# Patient Record
Sex: Female | Born: 1970 | Race: White | Hispanic: No | Marital: Single | Smoking: Never smoker
Health system: Southern US, Community
[De-identification: ages and names within clinical notes are randomized; demographics above are authoritative.]

## PROBLEM LIST (undated history)

## (undated) DIAGNOSIS — F32A Depression, unspecified: Secondary | ICD-10-CM

## (undated) DIAGNOSIS — K219 Gastro-esophageal reflux disease without esophagitis: Secondary | ICD-10-CM

## (undated) DIAGNOSIS — M069 Rheumatoid arthritis, unspecified: Secondary | ICD-10-CM

## (undated) DIAGNOSIS — G43909 Migraine, unspecified, not intractable, without status migrainosus: Secondary | ICD-10-CM

## (undated) DIAGNOSIS — M810 Age-related osteoporosis without current pathological fracture: Secondary | ICD-10-CM

## (undated) DIAGNOSIS — S82009A Unspecified fracture of unspecified patella, initial encounter for closed fracture: Secondary | ICD-10-CM

## (undated) DIAGNOSIS — F419 Anxiety disorder, unspecified: Secondary | ICD-10-CM

## (undated) DIAGNOSIS — G2581 Restless legs syndrome: Secondary | ICD-10-CM

## (undated) DIAGNOSIS — G4730 Sleep apnea, unspecified: Secondary | ICD-10-CM

## (undated) HISTORY — PX: BREAST ENHANCEMENT SURGERY: SHX7

## (undated) HISTORY — DX: Depression, unspecified: F32.A

## (undated) HISTORY — DX: Restless legs syndrome: G25.81

## (undated) HISTORY — DX: Rheumatoid arthritis, unspecified: M06.9

## (undated) HISTORY — DX: Anxiety disorder, unspecified: F41.9

## (undated) HISTORY — DX: Migraine, unspecified, not intractable, without status migrainosus: G43.909

---

## 1989-11-29 HISTORY — PX: OTHER SURGICAL HISTORY: SHX169

## 2018-12-15 DIAGNOSIS — F418 Other specified anxiety disorders: Secondary | ICD-10-CM | POA: Insufficient documentation

## 2018-12-15 DIAGNOSIS — R5383 Other fatigue: Secondary | ICD-10-CM | POA: Insufficient documentation

## 2019-03-28 DIAGNOSIS — Z7960 Long term (current) use of unspecified immunomodulators and immunosuppressants: Secondary | ICD-10-CM | POA: Insufficient documentation

## 2019-05-03 DIAGNOSIS — R339 Retention of urine, unspecified: Secondary | ICD-10-CM | POA: Insufficient documentation

## 2019-07-11 DIAGNOSIS — F988 Other specified behavioral and emotional disorders with onset usually occurring in childhood and adolescence: Secondary | ICD-10-CM | POA: Insufficient documentation

## 2020-07-11 DIAGNOSIS — Z5181 Encounter for therapeutic drug level monitoring: Secondary | ICD-10-CM | POA: Diagnosis not present

## 2020-07-11 DIAGNOSIS — R252 Cramp and spasm: Secondary | ICD-10-CM | POA: Diagnosis not present

## 2020-07-11 DIAGNOSIS — Z1283 Encounter for screening for malignant neoplasm of skin: Secondary | ICD-10-CM | POA: Diagnosis not present

## 2020-07-11 DIAGNOSIS — Z13220 Encounter for screening for lipoid disorders: Secondary | ICD-10-CM | POA: Diagnosis not present

## 2020-08-19 DIAGNOSIS — F329 Major depressive disorder, single episode, unspecified: Secondary | ICD-10-CM | POA: Diagnosis not present

## 2020-08-19 DIAGNOSIS — G2581 Restless legs syndrome: Secondary | ICD-10-CM | POA: Diagnosis not present

## 2020-08-19 DIAGNOSIS — Z79899 Other long term (current) drug therapy: Secondary | ICD-10-CM | POA: Diagnosis not present

## 2020-08-19 DIAGNOSIS — M069 Rheumatoid arthritis, unspecified: Secondary | ICD-10-CM | POA: Diagnosis not present

## 2020-08-29 DIAGNOSIS — F3341 Major depressive disorder, recurrent, in partial remission: Secondary | ICD-10-CM | POA: Diagnosis not present

## 2020-09-19 DIAGNOSIS — G43909 Migraine, unspecified, not intractable, without status migrainosus: Secondary | ICD-10-CM | POA: Diagnosis not present

## 2020-09-23 DIAGNOSIS — F419 Anxiety disorder, unspecified: Secondary | ICD-10-CM | POA: Diagnosis not present

## 2020-09-29 DIAGNOSIS — L578 Other skin changes due to chronic exposure to nonionizing radiation: Secondary | ICD-10-CM | POA: Diagnosis not present

## 2020-09-29 DIAGNOSIS — L570 Actinic keratosis: Secondary | ICD-10-CM | POA: Diagnosis not present

## 2020-09-29 DIAGNOSIS — C44519 Basal cell carcinoma of skin of other part of trunk: Secondary | ICD-10-CM | POA: Diagnosis not present

## 2020-10-10 DIAGNOSIS — F4323 Adjustment disorder with mixed anxiety and depressed mood: Secondary | ICD-10-CM | POA: Diagnosis not present

## 2020-10-15 DIAGNOSIS — F332 Major depressive disorder, recurrent severe without psychotic features: Secondary | ICD-10-CM | POA: Diagnosis not present

## 2020-10-29 ENCOUNTER — Other Ambulatory Visit: Payer: Self-pay | Admitting: Neurology

## 2020-10-29 ENCOUNTER — Encounter: Payer: Self-pay | Admitting: Neurology

## 2020-10-29 DIAGNOSIS — Z01419 Encounter for gynecological examination (general) (routine) without abnormal findings: Secondary | ICD-10-CM | POA: Diagnosis not present

## 2020-10-30 ENCOUNTER — Ambulatory Visit (INDEPENDENT_AMBULATORY_CARE_PROVIDER_SITE_OTHER): Payer: BC Managed Care – PPO | Admitting: Neurology

## 2020-10-30 ENCOUNTER — Encounter: Payer: Self-pay | Admitting: Neurology

## 2020-10-30 VITALS — BP 126/78 | HR 85 | Ht 68.5 in | Wt 181.0 lb

## 2020-10-30 DIAGNOSIS — G44011 Episodic cluster headache, intractable: Secondary | ICD-10-CM | POA: Diagnosis not present

## 2020-10-30 DIAGNOSIS — R519 Headache, unspecified: Secondary | ICD-10-CM

## 2020-10-30 DIAGNOSIS — G478 Other sleep disorders: Secondary | ICD-10-CM

## 2020-10-30 DIAGNOSIS — M0579 Rheumatoid arthritis with rheumatoid factor of multiple sites without organ or systems involvement: Secondary | ICD-10-CM

## 2020-10-30 DIAGNOSIS — G2581 Restless legs syndrome: Secondary | ICD-10-CM | POA: Diagnosis not present

## 2020-10-30 MED ORDER — EMGALITY (300 MG DOSE) 100 MG/ML ~~LOC~~ SOSY: 100.0000 mg | PREFILLED_SYRINGE | SUBCUTANEOUS | 5 refills | Status: DC

## 2020-10-30 MED ORDER — EMGALITY 120 MG/ML ~~LOC~~ SOSY: 240.0000 mg | PREFILLED_SYRINGE | Freq: Once | SUBCUTANEOUS | 0 refills | Status: AC

## 2020-10-30 MED ORDER — EMGALITY 120 MG/ML ~~LOC~~ SOAJ: 120.0000 mg | SUBCUTANEOUS | 5 refills | Status: DC

## 2020-10-30 NOTE — Addendum Note (Signed)
Addended by: Judi Cong on: 10/30/2020 10:50 AM   Modules accepted: Orders

## 2020-10-30 NOTE — Patient Instructions (Signed)
Screening for Sleep Apnea  Sleep apnea is a condition in which breathing pauses or becomes shallow during sleep. Sleep apnea screening is a test to determine if you are at risk for sleep apnea. The test is easy and only takes a few minutes. Your health care provider may ask you to have this test in preparation for surgery or as part of a physical exam. What are the symptoms of sleep apnea? Common symptoms of sleep apnea include:  Snoring.  Restless sleep.  Daytime sleepiness.  Pauses in breathing.  Choking during sleep.  Irritability.  Forgetfulness.  Trouble thinking clearly.  Depression.  Personality changes. Most people with sleep apnea are not aware that they have it. Why should I get screened? Getting screened for sleep apnea can help:  Ensure your safety. It is important for your health care providers to know whether or not you have sleep apnea, especially if you are having surgery or have other long-term (chronic) health conditions.  Improve your health and allow you to get a better night's rest. Restful sleep can help you: ? Have more energy. ? Lose weight. ? Improve high blood pressure. ? Improve diabetes management. ? Prevent stroke. ? Prevent car accidents. How is screening done? Screening usually includes being asked a list of questions about your sleep quality. Some questions you may be asked include:  Do you snore?  Is your sleep restless?  Do you have daytime sleepiness?  Has a partner or spouse told you that you stop breathing during sleep?  Have you had trouble concentrating or memory loss? If your screening test is positive, you are at risk for the condition. Further testing may be needed to confirm a diagnosis of sleep apnea. Where to find more information You can find screening tools online or at your health care clinic. For more information about sleep apnea screening and healthy sleep, visit these websites:  Centers for Disease Control and  Prevention: DetailSports.is  American Sleep Apnea Association: www.sleepapnea.org Contact a health care provider if:  You think that you may have sleep apnea. Summary  Sleep apnea screening can help determine if you are at risk for sleep apnea.  It is important for your health care providers to know whether or not you have sleep apnea, especially if you are having surgery or have other chronic health conditions.  You may be asked to take a screening test for sleep apnea in preparation for surgery or as part of a physical exam. This information is not intended to replace advice given to you by your health care provider. Make sure you discuss any questions you have with your health care provider. Document Revised: 09/01/2018 Document Reviewed: 02/25/2017 Elsevier Patient Education  2020 Elsevier Inc. [place] injection What is this medicine? GALCANEZUMAB (gal ka NEZ ue mab) is used to prevent migraines and treat cluster headaches. This medicine may be used for other purposes; ask your health care provider or pharmacist if you have questions. COMMON BRAND NAME(S): Emgality What should I tell my health care provider before I take this medicine? They need to know if you have any of these conditions:  an unusual or allergic reaction to galcanezumab, other medicines, foods, dyes, or preservatives  pregnant or trying to get pregnant  breast-feeding How should I use this medicine? This medicine is for injection under the skin. You will be taught how to prepare and give this medicine. Use exactly as directed. Take your medicine at regular intervals. Do not take your medicine more often  than directed. It is important that you put your used needles and syringes in a special sharps container. Do not put them in a trash can. If you do not have a sharps container, call your pharmacist or healthcare provider to get one. Talk to your pediatrician regarding the use of this medicine in  children. Special care may be needed. Overdosage: If you think you have taken too much of this medicine contact a poison control center or emergency room at once. NOTE: This medicine is only for you. Do not share this medicine with others. What if I miss a dose? If you miss a dose, take it as soon as you can. If it is almost time for your next dose, take only that dose. Do not take double or extra doses. What may interact with this medicine? Interactions are not expected. This list may not describe all possible interactions. Give your health care provider a list of all the medicines, herbs, non-prescription drugs, or dietary supplements you use. Also tell them if you smoke, drink alcohol, or use illegal drugs. Some items may interact with your medicine. What should I watch for while using this medicine? Tell your doctor or healthcare professional if your symptoms do not start to get better or if they get worse. What side effects may I notice from receiving this medicine? Side effects that you should report to your doctor or health care professional as soon as possible:  allergic reactions like skin rash, itching or hives, swelling of the face, lips, or tongue Side effects that usually do not require medical attention (report these to your doctor or health care professional if they continue or are bothersome):  pain, redness, or irritation at site where injected This list may not describe all possible side effects. Call your doctor for medical advice about side effects. You may report side effects to FDA at 1-800-FDA-1088. Where should I keep my medicine? Keep out of the reach of children. You will be instructed on how to store this medicine. Throw away any unused medicine after the expiration date on the label. NOTE: This sheet is a summary. It may not cover all possible information. If you have questions about this medicine, talk to your doctor, pharmacist, or health care provider.  2020  Elsevier/Gold Standard (2018-05-03 12:03:23)

## 2020-10-30 NOTE — Progress Notes (Addendum)
SLEEP MEDICINE CLINIC    Provider:  Melvyn Novas, MD  Primary Care Physician:  Shon Hale, MD [address] [place] [address]     Referring Provider: Shon Hale, Md [address] [place],  [place] [address]          Chief Complaint according to patient   Patient presents with:    . New Patient (Initial Visit)     Per CMA: pt has a hx of migraines and was treated for migrained by neurologist in [place], [place]. she moved her and PCP wanted pt to have a sleep eval to determine if OSA is a cause behind her waking up with headaches and migraines. pt takes maxalt for acute migraine management.       HISTORY OF PRESENT ILLNESS:  Toni Atkinson is a 49 y.o. year old White or Caucasian female patient seen here as in a referral for a sleep consultation on 10/30/2020 . Chief concern according to patient : long standing of migraines and has been treated by neurologist in [place], [place], until she moved here. Her PCP wanted  a sleep evaluation to see if OSA is a cause behind her waking up with headaches and migraines. The patient takes Maxalt for acute migraine management.  She averages 4-10 migraines a month- there are times she will use all the maxalt in one month time . She also was prescribed gabapentin initially for preventionof migraines and for RLS. She tried in past topamax which didnt work    I have the pleasure of seeing Toni Atkinson on 10-30-2020 a right -handed  Caucasian female with a possible OSA sleep disorder, causing morning headaches.  She has a past medical history of Anxiety and depression, Migraine, RA (rheumatoid arthritis) (HCC), and RLS (restless legs syndrome).  Sleep relevant medical history:  She wakes up with headaches- in her opinion arising from her neck, had a dx of DDD/ chiropractor diagnosis, worsening after a fall.  No nausea, no vision changes, no aura, no photophobia.   On contrast, the headaches that wake her are "sharper", not  dull, not throbbing, more focal , like a stabbing in the eye -mostly left sided. No tearing, no flushing.They last not long. They decreased in frequency- they are clustered. RLS treated by rheumatologist at 6 capsules of  300 mg gabapentin a day.    Family medical /sleep history: No other family member  with OSA, insomnia, sleep walkers. sister has migraines, too.  Son has lupus. Mother a has osteoarthritis, mother was one of 48 kids- uncles had CAD, MI and CVA. Marland Kitchen    Social history:  Patient is working as Occupational hygienist- Advertising account planner, and lives in a household alone with her dog.. Tobacco use- none .   ETOH use socially-, Caffeine intake in form of Coffee( 1 cup in AM) . Regular exercise in form of nothing.      Sleep habits are as follows: The patient's dinner time is between 5 PM if she is off- on workdays she can't have supper before 7PM. . The patient goes to bed at 9 PM and goes usually to sleep by 10 Pm, watching Tv in the bedroom.  continues to sleep for 3  hours, no bathroom breaks, but after 3 AM her sleep is more fragmented. Gabapentin helps to stay asleep.  The preferred sleep position is on either side,mostly her left, , with the support of 2 pillows.  Dreams are reportedly frequent.  5.30  AM is the usual rise time on workdays, and 8.30 on non-work days. The patient wakes up with an alarm.   She reports not feeling refreshed or restored in AM, with symptoms such as morning headaches ( 2 weekly)  , and residual fatigue. Naps are taken inrequently. Cluster headaches 3/ week,    Review of Systems: Out of a complete 14 system review, the patient complains of only the following symptoms, and all other reviewed systems are negative.:   RLS, cervicalgia. Migraine, cluster headaches  and morning headaches.  Fatigue, sleepiness , witnessed snoring, fragmented sleep, Insomnia    How likely are you to doze in the following situations: 0 = not likely, 1 = slight chance, 2 = moderate chance, 3 =  high chance   Sitting and Reading? Watching Television? Sitting inactive in a public place (theater or meeting)? As a passenger in a car for an hour without a break? Lying down in the afternoon when circumstances permit? Sitting and talking to someone? Sitting quietly after lunch without alcohol? In a car, while stopped for a few minutes in traffic?   Total = 3 / 24 points   FSS endorsed at 19/ 63 points.   Social History   Socioeconomic History  . Marital status: Single    Spouse name: Not on file  . Number of children: Not on file  . Years of education: Not on file  . Highest education level: Not on file  Occupational History  . Not on file  Tobacco Use  . Smoking status: Never Smoker  . Smokeless tobacco: Never Used  Substance and Sexual Activity  . Alcohol use: Yes    Comment: socially  . Drug use: Not Currently  . Sexual activity: Not on file  Other Topics Concern  . Not on file  Social History Narrative  . Not on file   Social Determinants of Health   Financial Resource Strain:   . Difficulty of Paying Living Expenses: Not on file  Food Insecurity:   . Worried About Programme researcher, broadcasting/film/video in the Last Year: Not on file  . Ran Out of Food in the Last Year: Not on file  Transportation Needs:   . Lack of Transportation (Medical): Not on file  . Lack of Transportation (Non-Medical): Not on file  Physical Activity:   . Days of Exercise per Week: Not on file  . Minutes of Exercise per Session: Not on file  Stress:   . Feeling of Stress : Not on file  Social Connections:   . Frequency of Communication with Friends and Family: Not on file  . Frequency of Social Gatherings with Friends and Family: Not on file  . Attends Religious Services: Not on file  . Active Member of Clubs or Organizations: Not on file  . Attends Banker Meetings: Not on file  . Marital Status: Not on file    History reviewed. No pertinent family history.  Past Medical History:   Diagnosis Date  . Anxiety and depression   . Migraine   . RA (rheumatoid arthritis) (HCC)   . RLS (restless legs syndrome)     Past Surgical History:  Procedure Laterality Date  . laproscopic surgery mass on ovary  1991     Current Outpatient Medications on File Prior to Visit  Medication Sig Dispense Refill  . acyclovir (ZOVIRAX) 200 MG capsule Take 200 mg by mouth at bedtime.    . busPIRone (BUSPAR) 10 MG tablet Take 10 mg by  mouth 2 (two) times daily.    . cyclobenzaprine (FLEXERIL) 10 MG tablet Take by mouth.    Marland Kitchen FLUoxetine (PROZAC) 20 MG capsule Take 60 mg by mouth every morning.    . gabapentin (NEURONTIN) 300 MG capsule Take 1 capsule by mouth 3 (three) times daily. 1 cap in am, 1 at lunch and 4 at bedtime    . rizatriptan (MAXALT) 10 MG tablet Take by mouth.    . Tofacitinib Citrate ER (XELJANZ XR) 11 MG TB24 TAKE ONE TABLET BY MOUTH ONCE DAILY. MAY BE TAKEN WITH OR WITHOUT FOOD. SWALLOW TABLET WHOLE. DO NOT CRUSH, SPLIT OR CHEW. STORE AT ROOM TEMPERATURE.     No current facility-administered medications on file prior to visit.    Not on File  Physical exam:  Today's Vitals   10/30/20 0853  BP: 126/78  Pulse: 85  Weight: 181 lb (82.1 kg)  Height: 5' 8.5" (1.74 m)   Body mass index is 27.12 kg/m.   Wt Readings from Last 3 Encounters:  10/30/20 181 lb (82.1 kg)     Ht Readings from Last 3 Encounters:  10/30/20 5' 8.5" (1.74 m)      General: The patient is awake, alert and appears not in acute distress. The patient is well groomed. Head: Normocephalic, atraumatic. Neck is supple. Mallampati 3 ,  neck circumference:14 inches .  Nasal airflow patent.  Retrognathia is not seen, but a crossbite has manifested, and developed just recently. .  Dental status: intact.  Cardiovascular:  Regular rate and cardiac rhythm by pulse,  without distended neck veins. Respiratory: Lungs are clear to auscultation.  Skin:  Without evidence of ankle edema, or rash. Trunk: The  patient's posture is erect.   Neurologic exam : The patient is awake and alert, oriented to place and time.   Memory subjective described as intact.  Attention span & concentration ability appears normal.  Speech is fluent,  without  dysarthria, dysphonia or aphasia.  Mood and affect are appropriate.   Cranial nerves: no loss of smell or taste reported - not vaccinated and has not contracted covid.  Pupils are equal and briskly reactive to light. Funduscopic exam deferred.   Extraocular movements in vertical and horizontal planes were intact and without nystagmus. No Diplopia. Visual fields by finger perimetry are intact. Hearing was intact to soft voice and finger rubbing.    Facial sensation intact to fine touch.  Facial motor strength is symmetric and tongue and uvula move midline.  Neck ROM : rotation, tilt and flexion extension were normal for age and shoulder shrug was symmetrical.    Motor exam: Symmetric bulk, tone and ROM.   Normal tone without cog- wheeling, symmetric grip strength .  Sensory:  Fine touch, pinprick and vibration were tested  and  normal.  Proprioception tested in the upper extremities was normal. Coordination: Rapid alternating movements in the fingers/hands were of normal speed.  The Finger-to-nose maneuver was intact without evidence of ataxia, dysmetria or tremor.  Gait and station: Patient could rise unassisted from a seated position, walked without assistive device.  Stance is of normal width/ base and the patient turned with 3 steps.  Toe and heel walk were deferred.  Deep tendon reflexes: in the upper and lower extremities are symmetric and intact.  Babinski response was deferred .      After spending a total time of 55 minutes face to face and additional time for physical and neurologic examination, review of laboratory studies,  personal  review of imaging studies, reports and results of other testing and review of referral information / records as  far as provided in visit, I have established the following assessments:   Non restorative sleep, fragmented sleep and some sleep related headaches.   1) Morning headaches seem to be related to neck pain and are more of a tension type- this doesn't rule out the presence of sleep apnea.  2) Cluster headaches are a migraine subtype and have become less frequent on gabapentin, as have RLS. Cluster headaches are still up to 5 a month  These can be related to low oxygen saturation in NREM 3/4 stages.  3) RLS related to rheumatoid arthritis, according to her rheumatologist. 4) Overall still having migraines y up to 8 a month and would qualify for injectable therapy .     My Plan is to proceed with:  1) PSG or HST for this patient- RLS syndrome can only be appreciated in an attended sleep study.  2) ajovy, emgality or similar for migraine reduction.My nurse gave her the first shot of EMGALITY here, and she will receive a 30 day supply with 5 refills.   instructions given by RN.   3) RLS appears better controlled- after another increase in gabapentin. Dr. Rogelio Seen in [place].   I would like to [place] [address] [place],  [place] [address] for allowing me to meet with and to take care of this pleasant patient.  Karolee Ohs Waller will follow up either personally or through our NP within 3-4 month.   CC: I will share my notes with PCP.  Electronically signed by: Melvyn Novas, MD 10/30/2020 9:15 AM  [facility] Board certified by The [employer] of Sleep Medicine and Diplomate of the [employer] of Sleep Medicine. Board certified In Neurology through the ABPN, Fellow of the [employer] of Neurology. Medical Director of [facility].

## 2020-11-04 DIAGNOSIS — F332 Major depressive disorder, recurrent severe without psychotic features: Secondary | ICD-10-CM | POA: Diagnosis not present

## 2020-11-04 DIAGNOSIS — F4323 Adjustment disorder with mixed anxiety and depressed mood: Secondary | ICD-10-CM | POA: Diagnosis not present

## 2020-11-13 ENCOUNTER — Telehealth: Payer: Self-pay | Admitting: Neurology

## 2020-11-13 DIAGNOSIS — L578 Other skin changes due to chronic exposure to nonionizing radiation: Secondary | ICD-10-CM | POA: Diagnosis not present

## 2020-11-13 DIAGNOSIS — L570 Actinic keratosis: Secondary | ICD-10-CM | POA: Diagnosis not present

## 2020-11-13 DIAGNOSIS — Z85828 Personal history of other malignant neoplasm of skin: Secondary | ICD-10-CM | POA: Diagnosis not present

## 2020-11-13 NOTE — Telephone Encounter (Signed)
PA completed through cover my meds for the patient for Emgality. KEY: [id number] Will wait for response

## 2020-11-14 ENCOUNTER — Telehealth: Payer: Self-pay | Admitting: Neurology

## 2020-11-14 NOTE — Telephone Encounter (Signed)
Please review and provide additional information as requested.

## 2020-11-14 NOTE — Telephone Encounter (Signed)
I have to forward this to [place]:  Yes, the patient has had up to 5 cluster headaches a month, has some other migraine type headaches as well. See Note from 10-30-2020.

## 2020-11-14 NOTE — Telephone Encounter (Signed)
[employer] [facility]) called this is urgent PA;do you have documented anywhere pt has had 5 cluster headaches a month? Request a call back. Can contact: [phone]

## 2020-11-17 NOTE — Telephone Encounter (Signed)
Pt has called back in response to the message she received, please call.

## 2020-11-17 NOTE — Telephone Encounter (Signed)
[employer] [place] has denied coverage of emgality because pt has not had greater than or equal to 5 episodic cluster headache attacks per month.  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  I called pt to discuss. No answer, left a message asking her to call me back.

## 2020-11-17 NOTE — Telephone Encounter (Signed)
I called pt again to discuss. No answer, left a message asking her to call me back. 

## 2020-11-17 NOTE — Telephone Encounter (Signed)
Pt returned my call. She had downloaded the emgality copay card and will bring it to [facility]. She is due for her emgality injection on 11/30/20. She will let us know if she has any issues.

## 2020-11-17 NOTE — Telephone Encounter (Signed)
This is a duplicate phone message. Please provide any further documentation regarding her emgality on the original phone note from 11/13/2020.

## 2020-11-18 DIAGNOSIS — F332 Major depressive disorder, recurrent severe without psychotic features: Secondary | ICD-10-CM | POA: Diagnosis not present

## 2020-11-20 DIAGNOSIS — F332 Major depressive disorder, recurrent severe without psychotic features: Secondary | ICD-10-CM | POA: Diagnosis not present

## 2020-11-24 DIAGNOSIS — F332 Major depressive disorder, recurrent severe without psychotic features: Secondary | ICD-10-CM | POA: Diagnosis not present

## 2020-11-25 DIAGNOSIS — F332 Major depressive disorder, recurrent severe without psychotic features: Secondary | ICD-10-CM | POA: Diagnosis not present

## 2020-11-26 DIAGNOSIS — F332 Major depressive disorder, recurrent severe without psychotic features: Secondary | ICD-10-CM | POA: Diagnosis not present

## 2020-11-27 DIAGNOSIS — F332 Major depressive disorder, recurrent severe without psychotic features: Secondary | ICD-10-CM | POA: Diagnosis not present

## 2020-12-01 DIAGNOSIS — F332 Major depressive disorder, recurrent severe without psychotic features: Secondary | ICD-10-CM | POA: Diagnosis not present

## 2020-12-02 ENCOUNTER — Other Ambulatory Visit: Payer: Self-pay

## 2020-12-02 ENCOUNTER — Ambulatory Visit (INDEPENDENT_AMBULATORY_CARE_PROVIDER_SITE_OTHER): Payer: BC Managed Care – PPO | Admitting: Neurology

## 2020-12-02 DIAGNOSIS — M0579 Rheumatoid arthritis with rheumatoid factor of multiple sites without organ or systems involvement: Secondary | ICD-10-CM

## 2020-12-02 DIAGNOSIS — G4733 Obstructive sleep apnea (adult) (pediatric): Secondary | ICD-10-CM

## 2020-12-02 DIAGNOSIS — G2581 Restless legs syndrome: Secondary | ICD-10-CM

## 2020-12-02 DIAGNOSIS — R519 Headache, unspecified: Secondary | ICD-10-CM

## 2020-12-02 DIAGNOSIS — G478 Other sleep disorders: Secondary | ICD-10-CM

## 2020-12-02 DIAGNOSIS — G44011 Episodic cluster headache, intractable: Secondary | ICD-10-CM

## 2020-12-02 DIAGNOSIS — F332 Major depressive disorder, recurrent severe without psychotic features: Secondary | ICD-10-CM | POA: Diagnosis not present

## 2020-12-02 DIAGNOSIS — G4734 Idiopathic sleep related nonobstructive alveolar hypoventilation: Secondary | ICD-10-CM

## 2020-12-03 DIAGNOSIS — F332 Major depressive disorder, recurrent severe without psychotic features: Secondary | ICD-10-CM | POA: Diagnosis not present

## 2020-12-04 DIAGNOSIS — F332 Major depressive disorder, recurrent severe without psychotic features: Secondary | ICD-10-CM | POA: Diagnosis not present

## 2020-12-05 DIAGNOSIS — F332 Major depressive disorder, recurrent severe without psychotic features: Secondary | ICD-10-CM | POA: Diagnosis not present

## 2020-12-08 DIAGNOSIS — F332 Major depressive disorder, recurrent severe without psychotic features: Secondary | ICD-10-CM | POA: Diagnosis not present

## 2020-12-09 DIAGNOSIS — F332 Major depressive disorder, recurrent severe without psychotic features: Secondary | ICD-10-CM | POA: Diagnosis not present

## 2020-12-10 DIAGNOSIS — F332 Major depressive disorder, recurrent severe without psychotic features: Secondary | ICD-10-CM | POA: Diagnosis not present

## 2020-12-11 DIAGNOSIS — F332 Major depressive disorder, recurrent severe without psychotic features: Secondary | ICD-10-CM | POA: Diagnosis not present

## 2020-12-12 DIAGNOSIS — F332 Major depressive disorder, recurrent severe without psychotic features: Secondary | ICD-10-CM | POA: Diagnosis not present

## 2020-12-16 DIAGNOSIS — F332 Major depressive disorder, recurrent severe without psychotic features: Secondary | ICD-10-CM | POA: Diagnosis not present

## 2020-12-17 DIAGNOSIS — F332 Major depressive disorder, recurrent severe without psychotic features: Secondary | ICD-10-CM | POA: Diagnosis not present

## 2020-12-18 ENCOUNTER — Telehealth: Payer: Self-pay | Admitting: Neurology

## 2020-12-18 DIAGNOSIS — F332 Major depressive disorder, recurrent severe without psychotic features: Secondary | ICD-10-CM | POA: Diagnosis not present

## 2020-12-18 NOTE — Addendum Note (Signed)
Addended by: Melvyn Novas on: 12/18/2020 09:53 AM   Modules accepted: Orders

## 2020-12-18 NOTE — Telephone Encounter (Signed)
-----   Message from Melvyn Novas, MD sent at 12/18/2020  9:52 AM EST ----- of 0 apneas. There were 105 hypopneas with a hypopnea index of 17.2 /hour. The patient also had some snoring event related arousals (RERAs).      The total APNEA/HYPOPNEA INDEX (AHI) was 17.2/hour.  81 events occurred in REM sleep and 48 events in NREM. The REM AHI was  93.5 /hour, versus a non-REM AHI of 4.6.IMPRESSION:  1. There is mild- moderate Obstructive Sleep Apnea (OSA) with a very strong REM sleep dependency. This is driven by hypoventilation- her AHI consists of hypopneas, strongly associated with REM sleep.  2. 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  3. NO evidence of clinically significant Periodic Limb Movement Disorder (PLMD) 4. Primary Snoring  RECOMMENDATIONS:  Advise full-night, attended, CPAP titration study to optimize therapy.  This allows for oxygen titration ( if needed ) as well. There is a high probability of migraine and sleep related headaches being related to the above named findings.

## 2020-12-18 NOTE — Procedures (Signed)
PATIENT'S NAME:  Toni Atkinson, Toni Atkinson DOB:      1971-05-04      MR#:    [id number]     DATE OF RECORDING: 12/02/2020  Edwyna Ready REFERRING M.D.:  Garth Bigness, MD Study Performed:   Baseline Polysomnogram HISTORY:   Chief Complaint according to patient   Patient presents with:            Per CMA: This Patient has a hx of migraines and was treated by neurologist in [place], [place]. She moved here and her new PCP wanted her to have a sleep eval to determine if OSA is associated with headaches and migraines. She takes Maxalt for acute migraine management. She averages 4-10 migraines a month- there are times she will use all the Maxalt in one month time. She also was prescribed gabapentin initially for prevention nof migraines and for RLS. She tried in past topamax which didn't work.           Toni Atkinson is a 50 -year- old female who was seen upon request for a sleep consultation on 10/30/2020. She repsents with a question of possible OSA sleep disorder, causing morning headaches.  She has a past medical history of Anxiety and depression, Migraine, RA (rheumatoid arthritis) (HCC), and RLS (restless legs syndrome).  Sleep relevant medical history:  She wakes up with headaches- in her opinion arising from her neck, had a dx of DDD/ chiropractor diagnosis, worsening after a fall.   The patient endorsed the Epworth Sleepiness Scale at 3 points.   The patient's weight 181 pounds with a height of 68.5 (inches), resulting in a BMI of 27.4 kg/m2. The patient's neck circumference measured 14 inches.  CURRENT MEDICATIONS: Zovirax, Buspar, Flexeril, Prozac, Neurontin, Maxalt, Xeljanz XR   PROCEDURE:  This is a multichannel digital polysomnogram utilizing the Somnostar 11.2 system.  Electrodes and sensors were applied and monitored per AASM Specifications.   EEG, EOG, Chin and Limb EMG, were sampled at 200 Hz.  ECG, Snore and Nasal Pressure, Thermal Airflow, Respiratory Effort, CPAP Flow and Pressure, Oximetry was sampled at  50 Hz. Digital video and audio were recorded.      BASELINE STUDY: Lights Out was at 22:05 and Lights On at 04:45.  Total recording time (TRT) was 401 minutes, with a total sleep time (TST) of 366.5 minutes.   The patient's sleep latency was 23 minutes.  REM latency was 188 minutes.  The sleep efficiency was 91.4 %.     SLEEP ARCHITECTURE: WASO (Wake after sleep onset) was 11.5 minutes.  There were 14.5 minutes in Stage N1, 273.5 minutes Stage N2, 26.5 minutes Stage N3 and 52 minutes in Stage REM.  The percentage of Stage N1 was 4.%, Stage N2 was 74.6%, Stage N3 was 7.2% and Stage R (REM sleep) was 14.2%.   RESPIRATORY ANALYSIS:  There were a total of 105 respiratory events:  0 obstructive apneas, 0 central apneas and 0 mixed apneas with a total of 0 apneas. There were 105 hypopneas with a hypopnea index of 17.2 /hour. The patient also had some snoring event related arousals (RERAs).      The total APNEA/HYPOPNEA INDEX (AHI) was 17.2/hour.  81 events occurred in REM sleep and 48 events in NREM. The REM AHI was  93.5 /hour, versus a non-REM AHI of 4.6. The patient spent 193.5 minutes of total sleep time in the supine position and 173 minutes in non-supine. The supine AHI was 27.6/h versus a non-supine AHI of 5.5.  OXYGEN SATURATION &  C02:  The Wake baseline 02 saturation was 98%, with the lowest being 85%. Time spent below 89% saturation equaled 7 minutes.  The arousals were noted as: 33 were spontaneous, 6 were associated with PLMs, 53 were associated with respiratory events. The patient had a total of 8 Periodic Limb Movements. The Periodic Limb Movement (PLM) Arousal index was 1.0/hour. Audio and video analysis did not show any abnormal or unusual movements, behaviors, phonations or vocalizations.   Snoring was noted. EKG was in keeping with normal sinus rhythm (NSR).  IMPRESSION:  1. There is mild- moderate Obstructive Sleep Apnea (OSA) with a very strong REM sleep dependency. This is driven  by hypoventilation- her AHI consists of hypopneas, strongly associated with REM sleep.  2. 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  3. NO evidence of clinically significant Periodic Limb Movement Disorder (PLMD) 4. Primary Snoring  RECOMMENDATIONS:  Advise full-night, attended, CPAP titration study to optimize therapy.  This allows for oxygen titration ( if needed ) as well. There is a high probability of migraine and sleep related headaches being related to the above named findings.    I certify that I have reviewed the entire raw data recording prior to the issuance of this report in accordance with the Standards of Accreditation of the American Academy of Sleep Medicine (AASM)   Melvyn Novas, MD Diplomat, American Board of Psychiatry and Neurology  Diplomat, American Board of Sleep Medicine Wellsite geologist, [facility] [employer]

## 2020-12-18 NOTE — Telephone Encounter (Signed)
I called Toni Atkinson. I advised Toni Atkinson that Dr. Vickey Huger reviewed their sleep study results and found that has moderate OSA and recommends that Toni Atkinson be treated with a cpap. Dr. Vickey Huger recommends that Toni Atkinson return for a repeat sleep study in order to properly titrate the cpap and ensure a good mask fit. Toni Atkinson is agreeable to returning for a titration study. I advised Toni Atkinson that our sleep lab will file with Toni Atkinson's insurance and call Toni Atkinson to schedule the sleep study when we hear back from the Toni Atkinson's insurance regarding coverage of this sleep study. Toni Atkinson verbalized understanding of results. Toni Atkinson had no questions at this time but was encouraged to call back if questions arise.

## 2020-12-18 NOTE — Progress Notes (Signed)
of 0 apneas. There were 105 hypopneas with a hypopnea index of 17.2 /hour. The patient also had some snoring event related arousals (RERAs).   The total APNEA/HYPOPNEA INDEX (AHI) was 17.2/hour.  81 events occurred in REM sleep and 48 events in NREM. The REM AHI was  93.5 /hour, versus a non-REM AHI of 4.6.IMPRESSION:  1. There is mild- moderate Obstructive Sleep Apnea (OSA) with a very strong REM sleep dependency. This is driven by hypoventilation- her AHI consists of hypopneas, strongly associated with REM sleep.  2. 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  3. NO evidence of clinically significant Periodic Limb Movement Disorder (PLMD) 4. Primary Snoring  RECOMMENDATIONS:  Advise full-night, attended, CPAP titration study to optimize therapy.  This allows for oxygen titration ( if needed ) as well. There is a high probability of migraine and sleep related headaches being related to the above named findings.

## 2020-12-18 NOTE — Telephone Encounter (Signed)
Called patient to discuss sleep study results. No answer at this time. LVM for the patient to call back.   

## 2020-12-19 DIAGNOSIS — F332 Major depressive disorder, recurrent severe without psychotic features: Secondary | ICD-10-CM | POA: Diagnosis not present

## 2020-12-22 ENCOUNTER — Telehealth: Payer: Self-pay

## 2020-12-22 ENCOUNTER — Encounter: Payer: Self-pay | Admitting: Neurology

## 2020-12-22 ENCOUNTER — Other Ambulatory Visit: Payer: Self-pay | Admitting: Neurology

## 2020-12-22 DIAGNOSIS — R519 Headache, unspecified: Secondary | ICD-10-CM

## 2020-12-22 DIAGNOSIS — G2581 Restless legs syndrome: Secondary | ICD-10-CM

## 2020-12-22 DIAGNOSIS — F332 Major depressive disorder, recurrent severe without psychotic features: Secondary | ICD-10-CM | POA: Diagnosis not present

## 2020-12-22 DIAGNOSIS — G4733 Obstructive sleep apnea (adult) (pediatric): Secondary | ICD-10-CM

## 2020-12-22 DIAGNOSIS — G478 Other sleep disorders: Secondary | ICD-10-CM

## 2020-12-22 NOTE — Telephone Encounter (Signed)
Called the patient to advise that insurance would not approve for the in lab study. There was no answer.  Left a detailed message advising the patient of this information, and that we would order a auto CPAP.  Advised the patient I would send an order to a company that accepts her insurance.  Informed the patient there is a Scientist, clinical (histocompatibility and immunogenetics) on CPAP's and that it may take a while before they will be able to get her set up.  Advised the patient in the voicemail to call with any questions otherwise I will be sending a letter in the mail but recapped all the appropriate information as well as providing phone number to the company.

## 2020-12-22 NOTE — Telephone Encounter (Signed)
Order will be sent to [facility]).

## 2020-12-22 NOTE — Telephone Encounter (Signed)
Reviewed with Dr Vickey Huger and she recommends auto CPAP 5-15 cm water pressure with 2 cm EPR. Will contact pt and review

## 2020-12-22 NOTE — Telephone Encounter (Signed)
[employer] denied cpap titration. Approved PSG due to PLMS but will not approve cpap on this alone. Need auto cpap order sent.

## 2020-12-23 DIAGNOSIS — F332 Major depressive disorder, recurrent severe without psychotic features: Secondary | ICD-10-CM | POA: Diagnosis not present

## 2020-12-24 DIAGNOSIS — F332 Major depressive disorder, recurrent severe without psychotic features: Secondary | ICD-10-CM | POA: Diagnosis not present

## 2020-12-25 DIAGNOSIS — F332 Major depressive disorder, recurrent severe without psychotic features: Secondary | ICD-10-CM | POA: Diagnosis not present

## 2020-12-26 DIAGNOSIS — F332 Major depressive disorder, recurrent severe without psychotic features: Secondary | ICD-10-CM | POA: Diagnosis not present

## 2020-12-29 DIAGNOSIS — F332 Major depressive disorder, recurrent severe without psychotic features: Secondary | ICD-10-CM | POA: Diagnosis not present

## 2020-12-30 DIAGNOSIS — F332 Major depressive disorder, recurrent severe without psychotic features: Secondary | ICD-10-CM | POA: Diagnosis not present

## 2020-12-31 DIAGNOSIS — F332 Major depressive disorder, recurrent severe without psychotic features: Secondary | ICD-10-CM | POA: Diagnosis not present

## 2021-01-01 DIAGNOSIS — F332 Major depressive disorder, recurrent severe without psychotic features: Secondary | ICD-10-CM | POA: Diagnosis not present

## 2021-01-02 DIAGNOSIS — F332 Major depressive disorder, recurrent severe without psychotic features: Secondary | ICD-10-CM | POA: Diagnosis not present

## 2021-01-05 DIAGNOSIS — F332 Major depressive disorder, recurrent severe without psychotic features: Secondary | ICD-10-CM | POA: Diagnosis not present

## 2021-01-07 DIAGNOSIS — F332 Major depressive disorder, recurrent severe without psychotic features: Secondary | ICD-10-CM | POA: Diagnosis not present

## 2021-01-08 DIAGNOSIS — F332 Major depressive disorder, recurrent severe without psychotic features: Secondary | ICD-10-CM | POA: Diagnosis not present

## 2021-01-14 DIAGNOSIS — F332 Major depressive disorder, recurrent severe without psychotic features: Secondary | ICD-10-CM | POA: Diagnosis not present

## 2021-01-16 DIAGNOSIS — S93492A Sprain of other ligament of left ankle, initial encounter: Secondary | ICD-10-CM | POA: Diagnosis not present

## 2021-01-16 DIAGNOSIS — F332 Major depressive disorder, recurrent severe without psychotic features: Secondary | ICD-10-CM | POA: Diagnosis not present

## 2021-01-20 DIAGNOSIS — F332 Major depressive disorder, recurrent severe without psychotic features: Secondary | ICD-10-CM | POA: Diagnosis not present

## 2021-01-27 DIAGNOSIS — N952 Postmenopausal atrophic vaginitis: Secondary | ICD-10-CM | POA: Diagnosis not present

## 2021-01-27 DIAGNOSIS — N898 Other specified noninflammatory disorders of vagina: Secondary | ICD-10-CM | POA: Diagnosis not present

## 2021-02-16 DIAGNOSIS — M069 Rheumatoid arthritis, unspecified: Secondary | ICD-10-CM | POA: Diagnosis not present

## 2021-02-16 DIAGNOSIS — G2581 Restless legs syndrome: Secondary | ICD-10-CM | POA: Diagnosis not present

## 2021-02-16 DIAGNOSIS — Z79899 Other long term (current) drug therapy: Secondary | ICD-10-CM | POA: Diagnosis not present

## 2021-02-16 DIAGNOSIS — F32A Depression, unspecified: Secondary | ICD-10-CM | POA: Diagnosis not present

## 2021-02-24 DIAGNOSIS — R198 Other specified symptoms and signs involving the digestive system and abdomen: Secondary | ICD-10-CM | POA: Diagnosis not present

## 2021-02-24 DIAGNOSIS — F3341 Major depressive disorder, recurrent, in partial remission: Secondary | ICD-10-CM | POA: Diagnosis not present

## 2021-02-24 DIAGNOSIS — Z0000 Encounter for general adult medical examination without abnormal findings: Secondary | ICD-10-CM | POA: Diagnosis not present

## 2021-02-24 DIAGNOSIS — M069 Rheumatoid arthritis, unspecified: Secondary | ICD-10-CM | POA: Diagnosis not present

## 2021-02-24 DIAGNOSIS — Z13220 Encounter for screening for lipoid disorders: Secondary | ICD-10-CM | POA: Diagnosis not present

## 2021-03-02 ENCOUNTER — Other Ambulatory Visit: Payer: Self-pay | Admitting: Obstetrics and Gynecology

## 2021-03-02 DIAGNOSIS — J069 Acute upper respiratory infection, unspecified: Secondary | ICD-10-CM | POA: Diagnosis not present

## 2021-03-02 DIAGNOSIS — Z1231 Encounter for screening mammogram for malignant neoplasm of breast: Secondary | ICD-10-CM

## 2021-03-03 DIAGNOSIS — J069 Acute upper respiratory infection, unspecified: Secondary | ICD-10-CM | POA: Diagnosis not present

## 2021-03-03 DIAGNOSIS — Z03818 Encounter for observation for suspected exposure to other biological agents ruled out: Secondary | ICD-10-CM | POA: Diagnosis not present

## 2021-03-10 DIAGNOSIS — F332 Major depressive disorder, recurrent severe without psychotic features: Secondary | ICD-10-CM | POA: Diagnosis not present

## 2021-03-25 DIAGNOSIS — H16142 Punctate keratitis, left eye: Secondary | ICD-10-CM | POA: Diagnosis not present

## 2021-04-02 DIAGNOSIS — N898 Other specified noninflammatory disorders of vagina: Secondary | ICD-10-CM | POA: Diagnosis not present

## 2021-04-02 DIAGNOSIS — G4733 Obstructive sleep apnea (adult) (pediatric): Secondary | ICD-10-CM | POA: Diagnosis not present

## 2021-04-14 DIAGNOSIS — Z1159 Encounter for screening for other viral diseases: Secondary | ICD-10-CM | POA: Diagnosis not present

## 2021-04-14 DIAGNOSIS — Z20822 Contact with and (suspected) exposure to covid-19: Secondary | ICD-10-CM | POA: Diagnosis not present

## 2021-04-14 DIAGNOSIS — R059 Cough, unspecified: Secondary | ICD-10-CM | POA: Diagnosis not present

## 2021-04-14 DIAGNOSIS — R519 Headache, unspecified: Secondary | ICD-10-CM | POA: Diagnosis not present

## 2021-04-14 DIAGNOSIS — U071 COVID-19: Secondary | ICD-10-CM | POA: Diagnosis not present

## 2021-04-16 DIAGNOSIS — U071 COVID-19: Secondary | ICD-10-CM | POA: Diagnosis not present

## 2021-04-21 ENCOUNTER — Ambulatory Visit: Payer: BC Managed Care – PPO

## 2021-04-29 DIAGNOSIS — N760 Acute vaginitis: Secondary | ICD-10-CM | POA: Diagnosis not present

## 2021-05-03 DIAGNOSIS — G4733 Obstructive sleep apnea (adult) (pediatric): Secondary | ICD-10-CM | POA: Diagnosis not present

## 2021-05-26 DIAGNOSIS — G4733 Obstructive sleep apnea (adult) (pediatric): Secondary | ICD-10-CM | POA: Diagnosis not present

## 2021-06-02 DIAGNOSIS — G4733 Obstructive sleep apnea (adult) (pediatric): Secondary | ICD-10-CM | POA: Diagnosis not present

## 2021-07-03 DIAGNOSIS — G4733 Obstructive sleep apnea (adult) (pediatric): Secondary | ICD-10-CM | POA: Diagnosis not present

## 2021-07-05 NOTE — Telephone Encounter (Signed)
DME related order- not an encounter.

## 2021-08-03 DIAGNOSIS — G4733 Obstructive sleep apnea (adult) (pediatric): Secondary | ICD-10-CM | POA: Diagnosis not present

## 2021-08-19 DIAGNOSIS — M069 Rheumatoid arthritis, unspecified: Secondary | ICD-10-CM | POA: Diagnosis not present

## 2021-08-19 DIAGNOSIS — Z79899 Other long term (current) drug therapy: Secondary | ICD-10-CM | POA: Diagnosis not present

## 2021-08-19 DIAGNOSIS — G2581 Restless legs syndrome: Secondary | ICD-10-CM | POA: Diagnosis not present

## 2021-08-19 DIAGNOSIS — F32A Depression, unspecified: Secondary | ICD-10-CM | POA: Diagnosis not present

## 2021-09-02 DIAGNOSIS — G4733 Obstructive sleep apnea (adult) (pediatric): Secondary | ICD-10-CM | POA: Diagnosis not present

## 2021-09-03 DIAGNOSIS — D473 Essential (hemorrhagic) thrombocythemia: Secondary | ICD-10-CM | POA: Diagnosis not present

## 2021-09-03 DIAGNOSIS — F4323 Adjustment disorder with mixed anxiety and depressed mood: Secondary | ICD-10-CM | POA: Diagnosis not present

## 2021-09-03 DIAGNOSIS — R945 Abnormal results of liver function studies: Secondary | ICD-10-CM | POA: Diagnosis not present

## 2021-09-03 DIAGNOSIS — R7989 Other specified abnormal findings of blood chemistry: Secondary | ICD-10-CM | POA: Diagnosis not present

## 2021-09-15 DIAGNOSIS — F4323 Adjustment disorder with mixed anxiety and depressed mood: Secondary | ICD-10-CM | POA: Diagnosis not present

## 2021-09-30 DIAGNOSIS — F4323 Adjustment disorder with mixed anxiety and depressed mood: Secondary | ICD-10-CM | POA: Diagnosis not present

## 2021-10-03 DIAGNOSIS — G4733 Obstructive sleep apnea (adult) (pediatric): Secondary | ICD-10-CM | POA: Diagnosis not present

## 2021-10-12 DIAGNOSIS — F4323 Adjustment disorder with mixed anxiety and depressed mood: Secondary | ICD-10-CM | POA: Diagnosis not present

## 2021-10-28 DIAGNOSIS — F4323 Adjustment disorder with mixed anxiety and depressed mood: Secondary | ICD-10-CM | POA: Diagnosis not present

## 2021-11-02 DIAGNOSIS — G4733 Obstructive sleep apnea (adult) (pediatric): Secondary | ICD-10-CM | POA: Diagnosis not present

## 2021-11-03 DIAGNOSIS — B309 Viral conjunctivitis, unspecified: Secondary | ICD-10-CM | POA: Diagnosis not present

## 2021-11-03 DIAGNOSIS — J0100 Acute maxillary sinusitis, unspecified: Secondary | ICD-10-CM | POA: Diagnosis not present

## 2021-12-03 DIAGNOSIS — Z20822 Contact with and (suspected) exposure to covid-19: Secondary | ICD-10-CM | POA: Diagnosis not present

## 2021-12-03 DIAGNOSIS — G4733 Obstructive sleep apnea (adult) (pediatric): Secondary | ICD-10-CM | POA: Diagnosis not present

## 2021-12-16 DIAGNOSIS — M722 Plantar fascial fibromatosis: Secondary | ICD-10-CM | POA: Diagnosis not present

## 2021-12-17 DIAGNOSIS — J0110 Acute frontal sinusitis, unspecified: Secondary | ICD-10-CM | POA: Diagnosis not present

## 2022-01-03 DIAGNOSIS — G4733 Obstructive sleep apnea (adult) (pediatric): Secondary | ICD-10-CM | POA: Diagnosis not present

## 2022-01-13 DIAGNOSIS — M25561 Pain in right knee: Secondary | ICD-10-CM | POA: Diagnosis not present

## 2022-01-15 DIAGNOSIS — M25561 Pain in right knee: Secondary | ICD-10-CM | POA: Diagnosis not present

## 2022-01-19 ENCOUNTER — Other Ambulatory Visit: Payer: Self-pay | Admitting: Sports Medicine

## 2022-01-19 ENCOUNTER — Ambulatory Visit
Admission: RE | Admit: 2022-01-19 | Discharge: 2022-01-19 | Disposition: A | Discharge status: Home / Self Care | Payer: BC Managed Care – PPO | Source: Ambulatory Visit | Attending: Sports Medicine | Admitting: Sports Medicine

## 2022-01-19 DIAGNOSIS — M25561 Pain in right knee: Secondary | ICD-10-CM

## 2022-01-31 DIAGNOSIS — G4733 Obstructive sleep apnea (adult) (pediatric): Secondary | ICD-10-CM | POA: Diagnosis not present

## 2022-02-04 ENCOUNTER — Other Ambulatory Visit: Payer: Self-pay | Admitting: Sports Medicine

## 2022-02-04 DIAGNOSIS — M25561 Pain in right knee: Secondary | ICD-10-CM

## 2022-02-10 ENCOUNTER — Other Ambulatory Visit: Payer: Self-pay

## 2022-02-10 ENCOUNTER — Ambulatory Visit
Admission: RE | Admit: 2022-02-10 | Discharge: 2022-02-10 | Disposition: A | Discharge status: Home / Self Care | Payer: BC Managed Care – PPO | Source: Ambulatory Visit | Attending: Sports Medicine | Admitting: Sports Medicine

## 2022-02-10 DIAGNOSIS — M25561 Pain in right knee: Secondary | ICD-10-CM

## 2022-02-15 DIAGNOSIS — I8391 Asymptomatic varicose veins of right lower extremity: Secondary | ICD-10-CM | POA: Diagnosis not present

## 2022-02-15 DIAGNOSIS — C44712 Basal cell carcinoma of skin of right lower limb, including hip: Secondary | ICD-10-CM | POA: Diagnosis not present

## 2022-02-15 DIAGNOSIS — Z85828 Personal history of other malignant neoplasm of skin: Secondary | ICD-10-CM | POA: Diagnosis not present

## 2022-02-15 DIAGNOSIS — L820 Inflamed seborrheic keratosis: Secondary | ICD-10-CM | POA: Diagnosis not present

## 2022-02-15 DIAGNOSIS — L578 Other skin changes due to chronic exposure to nonionizing radiation: Secondary | ICD-10-CM | POA: Diagnosis not present

## 2022-02-16 DIAGNOSIS — Z79899 Other long term (current) drug therapy: Secondary | ICD-10-CM | POA: Diagnosis not present

## 2022-02-16 DIAGNOSIS — G2581 Restless legs syndrome: Secondary | ICD-10-CM | POA: Diagnosis not present

## 2022-02-16 DIAGNOSIS — M069 Rheumatoid arthritis, unspecified: Secondary | ICD-10-CM | POA: Diagnosis not present

## 2022-02-18 DIAGNOSIS — N760 Acute vaginitis: Secondary | ICD-10-CM | POA: Diagnosis not present

## 2022-02-18 DIAGNOSIS — N898 Other specified noninflammatory disorders of vagina: Secondary | ICD-10-CM | POA: Diagnosis not present

## 2022-03-03 DIAGNOSIS — G4733 Obstructive sleep apnea (adult) (pediatric): Secondary | ICD-10-CM | POA: Diagnosis not present

## 2022-03-11 DIAGNOSIS — M84451D Pathological fracture, right femur, subsequent encounter for fracture with routine healing: Secondary | ICD-10-CM | POA: Diagnosis not present

## 2022-03-19 DIAGNOSIS — G43009 Migraine without aura, not intractable, without status migrainosus: Secondary | ICD-10-CM | POA: Diagnosis not present

## 2022-03-19 DIAGNOSIS — Z23 Encounter for immunization: Secondary | ICD-10-CM | POA: Diagnosis not present

## 2022-03-19 DIAGNOSIS — F3341 Major depressive disorder, recurrent, in partial remission: Secondary | ICD-10-CM | POA: Diagnosis not present

## 2022-03-19 DIAGNOSIS — M069 Rheumatoid arthritis, unspecified: Secondary | ICD-10-CM | POA: Diagnosis not present

## 2022-03-19 DIAGNOSIS — Z13220 Encounter for screening for lipoid disorders: Secondary | ICD-10-CM | POA: Diagnosis not present

## 2022-03-19 DIAGNOSIS — Z0000 Encounter for general adult medical examination without abnormal findings: Secondary | ICD-10-CM | POA: Diagnosis not present

## 2022-03-19 DIAGNOSIS — Z79899 Other long term (current) drug therapy: Secondary | ICD-10-CM | POA: Diagnosis not present

## 2022-03-31 DIAGNOSIS — Z1211 Encounter for screening for malignant neoplasm of colon: Secondary | ICD-10-CM | POA: Diagnosis not present

## 2022-04-01 DIAGNOSIS — M84451D Pathological fracture, right femur, subsequent encounter for fracture with routine healing: Secondary | ICD-10-CM | POA: Diagnosis not present

## 2022-04-02 DIAGNOSIS — G4733 Obstructive sleep apnea (adult) (pediatric): Secondary | ICD-10-CM | POA: Diagnosis not present

## 2022-05-07 IMAGING — MR MR KNEE*R* W/O CM
4 of 6 series · 21 of 40 positions shown · non-contrast
Comparison: X-ray knee 01/19/2022.

CLINICAL DATA: Right knee pain and weakness for 1 month

EXAM:
MRI OF THE RIGHT KNEE WITHOUT CONTRAST
TECHNIQUE: Multiplanar, multisequence MR imaging of the knee was performed. No
intravenous contrast was administered.

[Series 3: T2 fat-sat · axial · 4.0mm · 0.50mm/px · z∈[-18,+87]mm · 3 of 27 slices shown (1 of 2)]
[im 6/27]
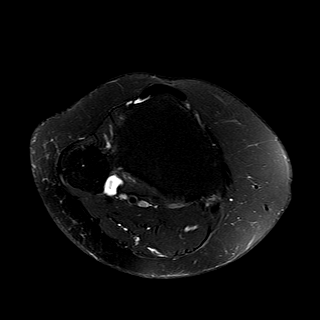
[im 16/27]
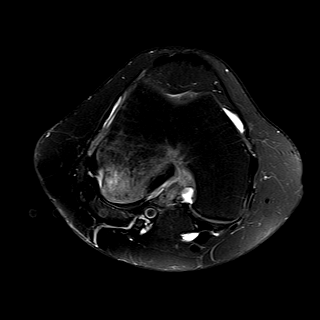
[im 27/27]
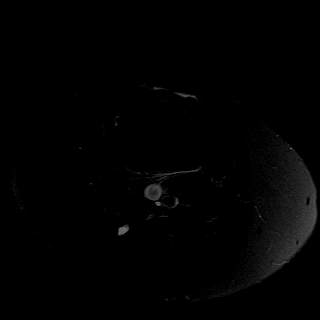

[Series 5: T2 fat-sat · coronal · 4.0mm · 0.29mm/px · 3 of 23 slices shown (2 of 2)]
[im 5/23]
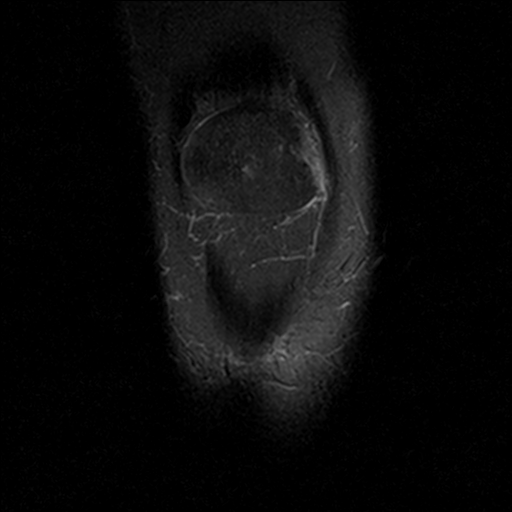
[im 14/23]
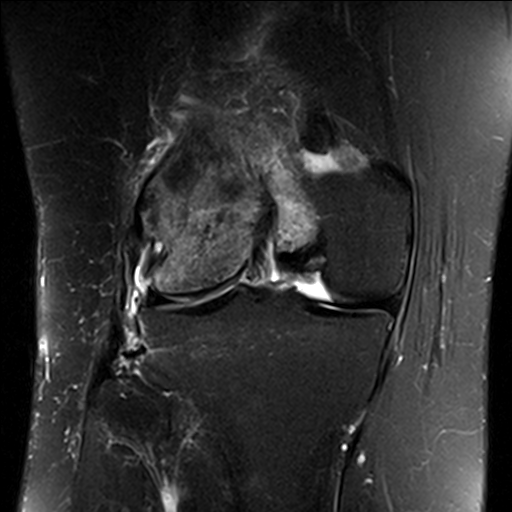
[im 23/23]
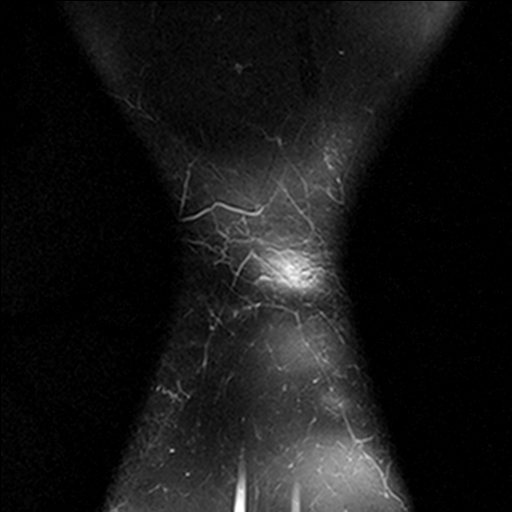

[Series 7: PD fat-sat · sagittal · 3.0mm · 0.29mm/px · 7 of 27 slices shown (1 of 2)]
[im 1/27]
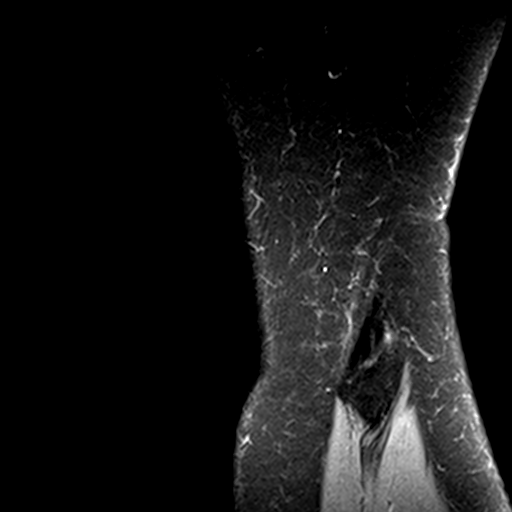
[im 5/27]
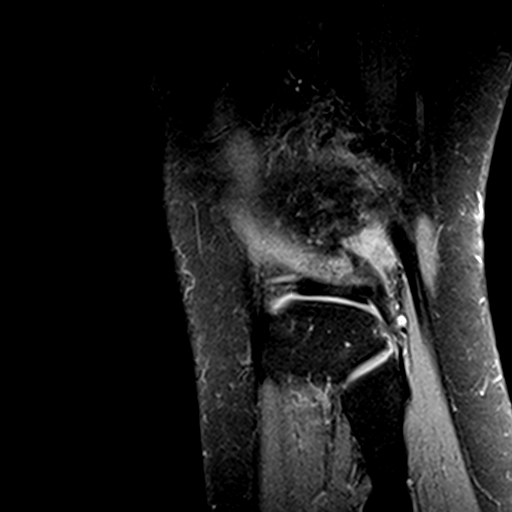
[im 9/27]
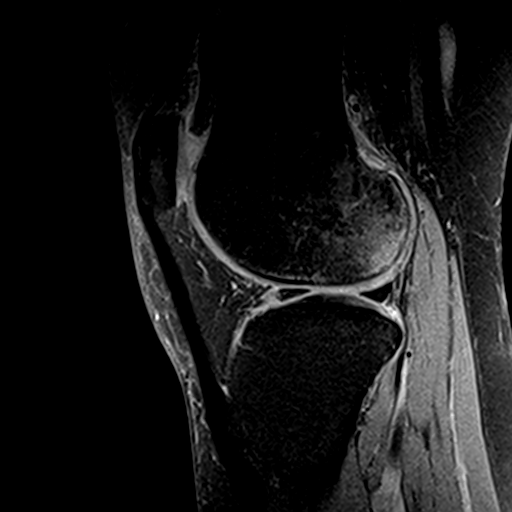
[im 14/27]
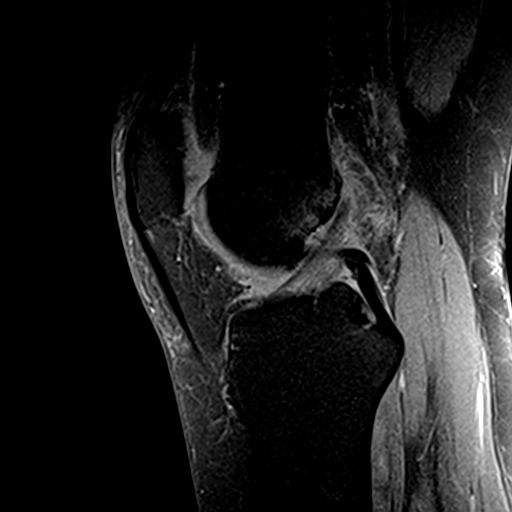
[im 18/27]
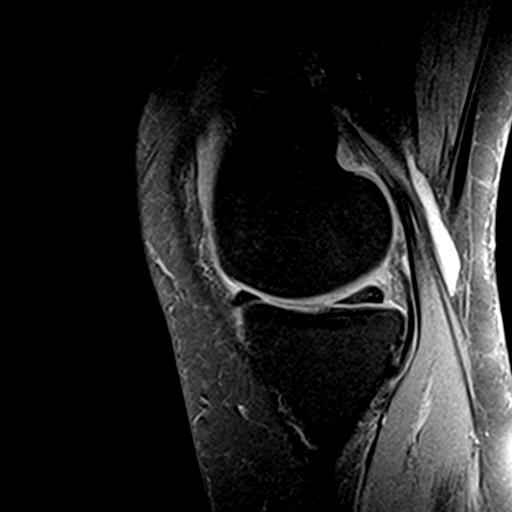
[im 22/27]
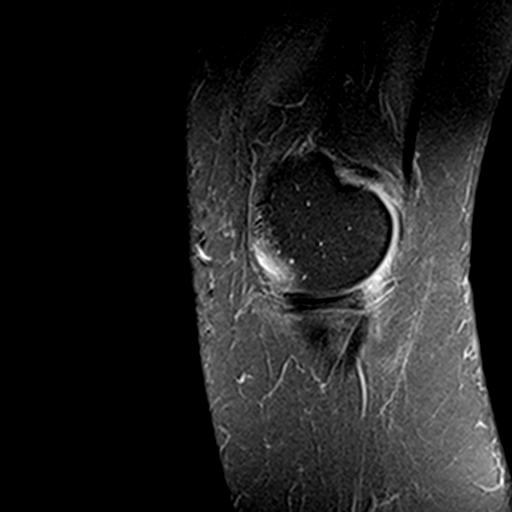
[im 27/27]
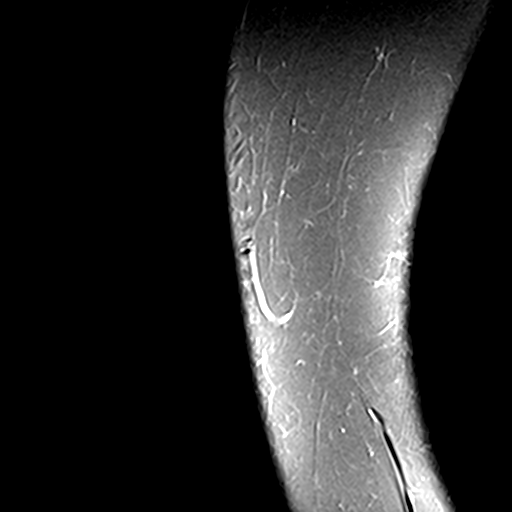

[Series 8: PD fat-sat · coronal · 3.0mm · 0.29mm/px · 8 of 30 slices shown (2 of 2)]
[im 1/30]
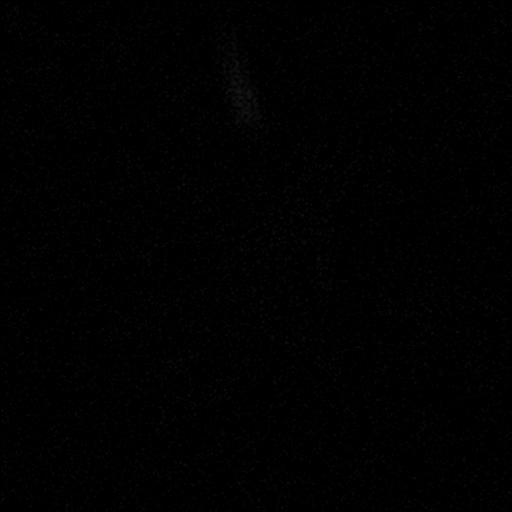
[im 5/30]
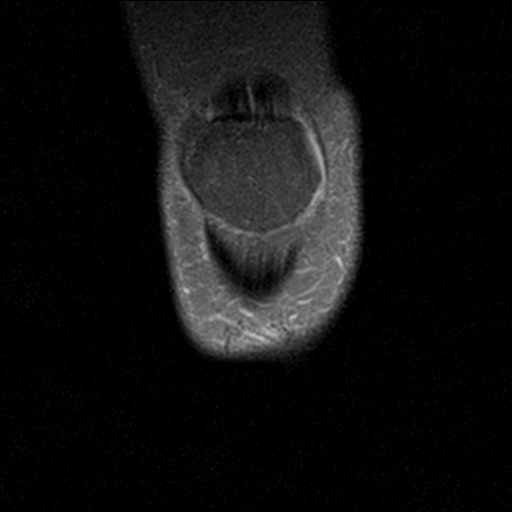
[im 9/30]
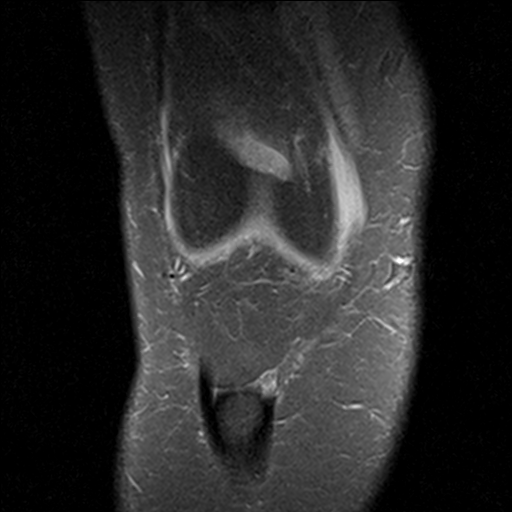
[im 13/30]
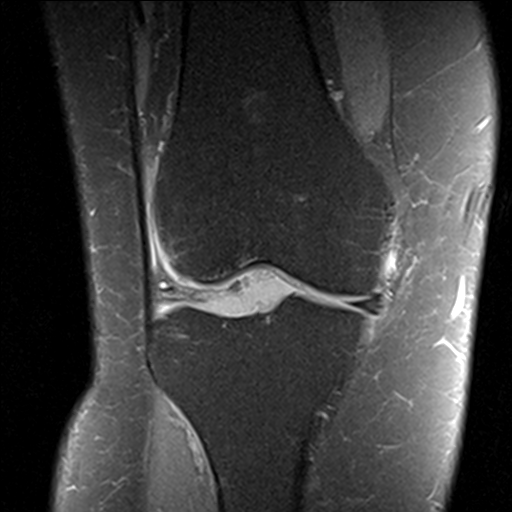
[im 17/30]
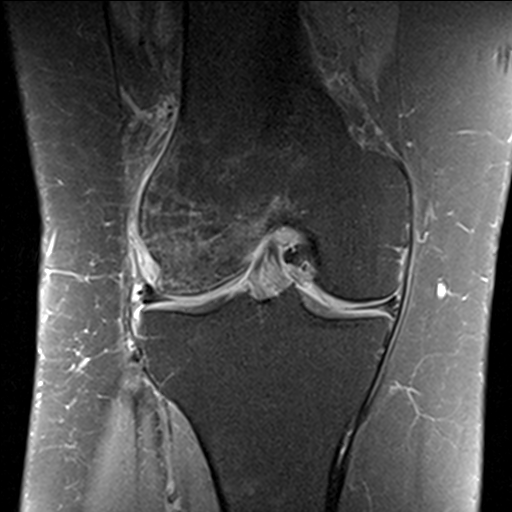
[im 21/30]
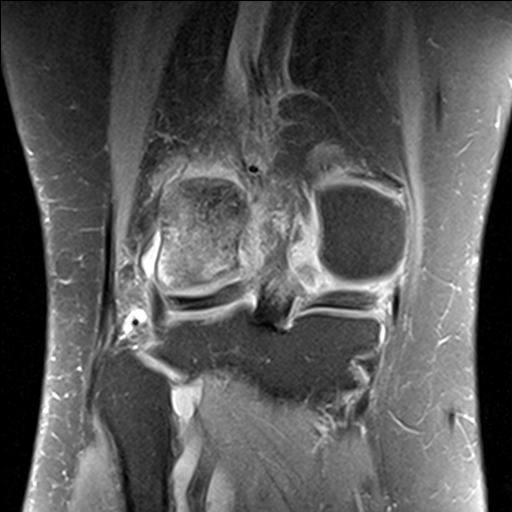
[im 25/30]
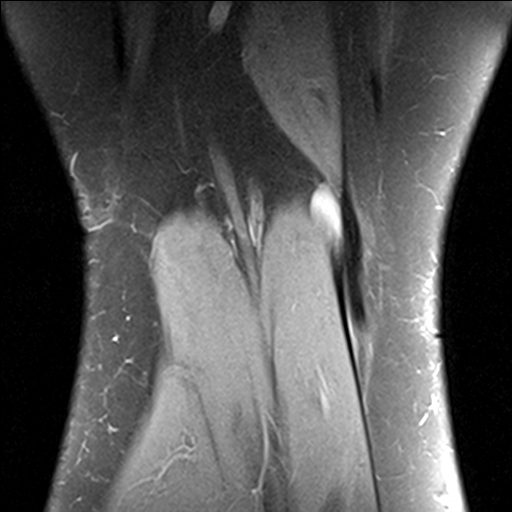
[im 30/30]
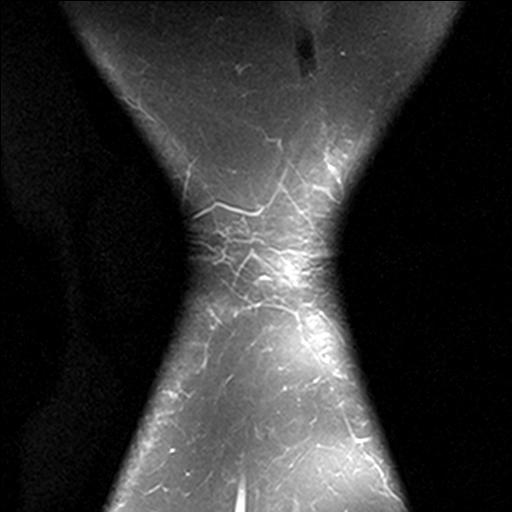

[21 of 40 positions shown; findings below may reference images not displayed]

FINDINGS: MENISCI

Medial: Intact.

Lateral: Intact.

LIGAMENTS

Cruciates: ACL and PCL are intact.

Collaterals: Medial collateral ligament is intact. Lateral
collateral ligament complex is intact.

CARTILAGE

Patellofemoral: Minimal cartilage fissuring of the medial patellar
facet.

Medial:  No chondral defect.

Lateral:  No chondral defect.

JOINT: Small joint effusion. Normal Lania Mh. No plical
thickening.

POPLITEAL FOSSA: Popliteus tendon is intact. Small Baker's cyst.

EXTENSOR MECHANISM: Intact quadriceps tendon. Intact patellar
tendon. Intact lateral patellar retinaculum. Intact medial patellar
retinaculum. Intact MPFL.

BONES: No aggressive osseous lesion. Severe subchondral bone marrow
edema in the posterior nonweightbearing surface of the lateral
femoral condyle with a subtle subchondral linear signal abnormality
concerning for a nondisplaced, nondepressed fracture.

Other: No fluid collection or hematoma. Muscles are normal.
IMPRESSION: 1. Severe subchondral bone marrow edema in the posterior
nonweightbearing surface of the lateral femoral condyle with a
subtle subchondral linear signal abnormality concerning for a
nondisplaced, nondepressed fracture.
2. No meniscal or ligamentous injury of the right knee.

## 2022-05-14 ENCOUNTER — Ambulatory Visit
Admission: RE | Admit: 2022-05-14 | Discharge: 2022-05-14 | Disposition: A | Discharge status: Home / Self Care | Payer: BC Managed Care – PPO | Source: Ambulatory Visit | Attending: Sports Medicine | Admitting: Sports Medicine

## 2022-05-14 ENCOUNTER — Other Ambulatory Visit: Payer: Self-pay | Admitting: Sports Medicine

## 2022-05-14 DIAGNOSIS — M25571 Pain in right ankle and joints of right foot: Secondary | ICD-10-CM

## 2022-05-18 DIAGNOSIS — C44519 Basal cell carcinoma of skin of other part of trunk: Secondary | ICD-10-CM | POA: Diagnosis not present

## 2022-05-18 DIAGNOSIS — L820 Inflamed seborrheic keratosis: Secondary | ICD-10-CM | POA: Diagnosis not present

## 2022-05-18 DIAGNOSIS — Z85828 Personal history of other malignant neoplasm of skin: Secondary | ICD-10-CM | POA: Diagnosis not present

## 2022-06-02 DIAGNOSIS — M25571 Pain in right ankle and joints of right foot: Secondary | ICD-10-CM | POA: Diagnosis not present

## 2022-06-08 ENCOUNTER — Other Ambulatory Visit: Payer: Self-pay | Admitting: Sports Medicine

## 2022-06-08 DIAGNOSIS — M25571 Pain in right ankle and joints of right foot: Secondary | ICD-10-CM

## 2022-06-16 ENCOUNTER — Ambulatory Visit
Admission: RE | Admit: 2022-06-16 | Discharge: 2022-06-16 | Disposition: A | Discharge status: Home / Self Care | Payer: BC Managed Care – PPO | Source: Ambulatory Visit | Attending: Sports Medicine | Admitting: Sports Medicine

## 2022-06-16 DIAGNOSIS — M25571 Pain in right ankle and joints of right foot: Secondary | ICD-10-CM

## 2022-06-18 DIAGNOSIS — S92114D Nondisplaced fracture of neck of right talus, subsequent encounter for fracture with routine healing: Secondary | ICD-10-CM | POA: Diagnosis not present

## 2022-06-22 ENCOUNTER — Other Ambulatory Visit: Payer: Self-pay | Admitting: Family Medicine

## 2022-06-22 DIAGNOSIS — S92114D Nondisplaced fracture of neck of right talus, subsequent encounter for fracture with routine healing: Secondary | ICD-10-CM

## 2022-06-25 DIAGNOSIS — S92101A Unspecified fracture of right talus, initial encounter for closed fracture: Secondary | ICD-10-CM | POA: Diagnosis not present

## 2022-06-25 DIAGNOSIS — M79671 Pain in right foot: Secondary | ICD-10-CM | POA: Diagnosis not present

## 2022-06-26 DIAGNOSIS — G4733 Obstructive sleep apnea (adult) (pediatric): Secondary | ICD-10-CM | POA: Diagnosis not present

## 2022-06-28 ENCOUNTER — Other Ambulatory Visit: Payer: Self-pay | Admitting: Orthopaedic Surgery

## 2022-06-28 ENCOUNTER — Ambulatory Visit
Admission: RE | Admit: 2022-06-28 | Discharge: 2022-06-28 | Disposition: A | Discharge status: Home / Self Care | Payer: BC Managed Care – PPO | Source: Ambulatory Visit | Attending: Orthopaedic Surgery | Admitting: Orthopaedic Surgery

## 2022-06-28 DIAGNOSIS — S92101A Unspecified fracture of right talus, initial encounter for closed fracture: Secondary | ICD-10-CM

## 2022-06-28 DIAGNOSIS — M7989 Other specified soft tissue disorders: Secondary | ICD-10-CM | POA: Diagnosis not present

## 2022-07-02 DIAGNOSIS — S92101A Unspecified fracture of right talus, initial encounter for closed fracture: Secondary | ICD-10-CM | POA: Diagnosis not present

## 2022-07-27 DIAGNOSIS — G4733 Obstructive sleep apnea (adult) (pediatric): Secondary | ICD-10-CM | POA: Diagnosis not present

## 2022-08-13 DIAGNOSIS — M722 Plantar fascial fibromatosis: Secondary | ICD-10-CM | POA: Insufficient documentation

## 2022-08-13 DIAGNOSIS — S92101A Unspecified fracture of right talus, initial encounter for closed fracture: Secondary | ICD-10-CM | POA: Diagnosis not present

## 2022-08-18 DIAGNOSIS — M8430XA Stress fracture, unspecified site, initial encounter for fracture: Secondary | ICD-10-CM | POA: Diagnosis not present

## 2022-08-18 DIAGNOSIS — G2581 Restless legs syndrome: Secondary | ICD-10-CM | POA: Diagnosis not present

## 2022-08-18 DIAGNOSIS — Z79899 Other long term (current) drug therapy: Secondary | ICD-10-CM | POA: Diagnosis not present

## 2022-08-18 DIAGNOSIS — M069 Rheumatoid arthritis, unspecified: Secondary | ICD-10-CM | POA: Diagnosis not present

## 2022-08-27 DIAGNOSIS — G4733 Obstructive sleep apnea (adult) (pediatric): Secondary | ICD-10-CM | POA: Diagnosis not present

## 2022-09-01 ENCOUNTER — Ambulatory Visit (HOSPITAL_COMMUNITY)
Admission: EM | Admit: 2022-09-01 | Discharge: 2022-09-01 | Disposition: A | Discharge status: Home / Self Care | Payer: BC Managed Care – PPO

## 2022-09-01 DIAGNOSIS — F331 Major depressive disorder, recurrent, moderate: Secondary | ICD-10-CM

## 2022-09-01 DIAGNOSIS — F320 Major depressive disorder, single episode, mild: Secondary | ICD-10-CM | POA: Diagnosis not present

## 2022-09-01 NOTE — Progress Notes (Signed)
   09/01/22 1426  [place] (Walk-ins at [facility] only)  How Did You Hear About [place]? Self  What Is the Reason for Your Visit/Call Today? Pt is a 51 yo female who presented voluntarily and unaccompanied due to worsening depression. Pt endorsed SI with mostly passive thoughts such as "wishing to be in a better place." Pt stated at times she thinks about intentionally overdosing on prescribed medications. Pt reported she did intentionally overdose about 4-5 years ago and was hospitalized following. She stated this is the only suicide attempt and the only hospitalization. Pt described being chronically depressed for 25/30 years with periods of deeper depression such as she has now. Pt denied HI, NSSH, AVH, paranoia and substance use except for 1-2 social occasions per month where she consumes some alcohol. She stated she does not drink until she passes out. Pt is employed and her PCP Dr. Thom Chimes prescribes her an anti-depressant currently. Pt does not currently see an OP therapist. Pt stated the only trigger she can think of is that she had to "put down" her dog about a month ago.  How Long Has This Been Causing You Problems? 1 wk - 1 month  Have You Recently Had Any Thoughts About Hurting Yourself? Yes  How long ago did you have thoughts about hurting yourself? a few days or a week  Are You Planning to [place] At This time? No  Have you Recently Had Thoughts About [place]? No  Are You Planning To Harm Someone At This Time? No  Are you currently experiencing any auditory, visual or other hallucinations? No  Have You Used Any Alcohol or Drugs in the Past 24 Hours? No  Do you have any current medical co-morbidities that require immediate attention? No  Clinician description of patient physical appearance/behavior: Pt was calm, cooperative, alert and appeared oriented. Pt did not appear to be responding to internal stimuli, experiencing delusional thinking  or to be intoxicated.  Pt's speech and movement appeared within normal limits and appearance was unremarkable. Pt's mood seemed solemn and somewhat depressed, and pt had a flat affect which was congruent. Pt's judgment and insight seemed within normal limits.  What Do You Feel Would Help You the Most Today? Treatment for Depression or other mood problem  Determination of Need Routine (7 days)  Options For Referral Outpatient Therapy;Medication Management   Mariza Bourget T. Mare Ferrari, [place], [facility], [facility] Triage Specialist [facility]

## 2022-09-01 NOTE — ED Notes (Signed)
Patient A&O x 4, ambulatory. Patient discharged in no acute distress. Patient denied SI/HI, A/VH upon discharge. Patient verbalized understanding of all discharge instructions explained by staff, to include follow up appointments. Patient escorted to lobby via staff for transport to destination. Safety maintained.

## 2022-09-01 NOTE — ED Provider Notes (Signed)
[facility] Medical Screening Exam  Patient Name: Toni Atkinson MRN: [id number] Date of Evaluation: 09/01/22 Chief Complaint:   Diagnosis:  Final diagnoses:  Current mild episode of major depressive disorder, unspecified whether recurrent ([place])  MDD (major depressive disorder), recurrent episode, moderate ([place])    History of Present illness: Toni Atkinson is a 50 y.o. female.  [facility].  Patient states that she has been feeling overwhelmed as she is transitioned from 1 department to another a month .  Patient feels that her new boss is not supportive, that she feels she cannot do anything right.  Patient reports that she has had depression for many years now, did well with [place] about 2 years ago, would like to restart [place].  Patient states that she came seeking help as her depression has been worsening, on a scale of 0-10, 0 being no symptoms and 10 being the worst, her depression is 8 or 9 out of 10.  She reports sometimes she has thoughts that she would be better off dead, as that she is thought about overdosing but knows that her family would be hurt, wants to feel better and currently does not feel she would harm herself.  Patient has that she would like to be out of work, does plan to present in the outpatient department in the morning and meet with the resident physician about doing [place] again.  Patient denies any psychotic symptoms, any thoughts of self-harm, harm to others.  Patient also denies any access to weapons.  She has that her sister is planning to come down on Friday and stay with her the weekend.  He reports that she is close to her mother and sister who lives in [place] and has no intentions of harming herself as long as she can be out of work, work on her depression by getting [employer]  Psychiatric Specialty Exam  Presentation  General Appearance:Appropriate for Environment; Well Groomed  Eye Contact:Fair  Speech:Clear and  Coherent  Speech Volume:Decreased  Handedness:Right   Mood and Affect  Mood: Depressed; Dysphoric  Affect: Constricted; Depressed; Tearful   Thought Process  Thought Processes: Coherent  Descriptions of Associations:Intact  Orientation:Full (Time, Place and Person)  Thought Content:Rumination; Logical    Hallucinations:None  Ideas of Reference:None  Suicidal Thoughts:No  Homicidal Thoughts:No   Sensorium  Memory: Immediate Fair; Recent Fair; Remote Fair  Judgment: Intact  Insight: Fair   Materials engineer: Fair  Attention Span: Fair  Recall: [employer] of Knowledge: Fair  Language: Fair   Psychomotor Activity  Psychomotor Activity:No data recorded  Assets  Assets: Desire for Improvement; Physical Health; Leisure Time; Housing   Sleep  Sleep:No data recorded Number of hours: No data recorded  No data recorded  Physical Exam: Physical Exam Constitutional:      Appearance: Normal appearance.  HENT:     Head: Normocephalic and atraumatic.     Nose: Nose normal.     Mouth/Throat:     Mouth: Mucous membranes are moist.  Eyes:     Extraocular Movements: Extraocular movements intact.     Conjunctiva/sclera: Conjunctivae normal.     Pupils: Pupils are equal, round, and reactive to light.  Cardiovascular:     Rate and Rhythm: Normal rate and regular rhythm.     Pulses: Normal pulses.     Heart sounds: Normal heart sounds.  Pulmonary:     Effort: Pulmonary effort is normal.     Breath sounds:  Normal breath sounds.  Musculoskeletal:        General: Normal range of motion.     Cervical back: Normal range of motion and neck supple.  Skin:    General: Skin is warm.  Neurological:     General: No focal deficit present.     Mental Status: She is alert and oriented to person, place, and time. Mental status is at baseline.    Review of Systems  Constitutional: Negative.  Negative for malaise/fatigue.  HENT:  Negative.    Eyes: Negative.  Negative for blurred vision, double vision, discharge and redness.  Respiratory: Negative.    Cardiovascular: Negative.  Negative for palpitations.  Skin: Negative.   Endo/Heme/Allergies:  Negative for environmental allergies.  Psychiatric/Behavioral:  Positive for depression, hallucinations and substance abuse. The patient is nervous/anxious and has insomnia.    There were no vitals taken for this visit. There is no height or weight on file to calculate BMI.  Musculoskeletal: Strength & Muscle Tone: within normal limits Gait & Station: normal Patient leans: N/A   [facility] MSE Discharge Disposition for Follow up and Recommendations: Based on my evaluation patient does not have a medical or psychiatric admission which requires any intervention Discussed in length with patient that she could come back to the [facility] at any time if she felt overwhelmed Nelly Rout, MD 09/01/2022, 3:12 PM

## 2022-09-01 NOTE — Discharge Instructions (Addendum)
Take all medications as prescribed. Keep all follow-up appointments as scheduled.  Do not consume alcohol or use illegal drugs while on prescription medications. Report any adverse effects from your medications to your primary care provider promptly.  In the event of recurrent symptoms or worsening symptoms, call 911, a crisis hotline, or go to the nearest emergency department for evaluation.   

## 2022-09-02 ENCOUNTER — Telehealth (HOSPITAL_COMMUNITY): Payer: Self-pay | Admitting: Psychiatry

## 2022-09-02 ENCOUNTER — Ambulatory Visit (HOSPITAL_COMMUNITY): Payer: BC Managed Care – PPO | Admitting: Student in an Organized Health Care Education/Training Program

## 2022-09-02 VITALS — BP 137/80 | HR 96 | Ht 68.5 in | Wt 193.6 lb

## 2022-09-02 DIAGNOSIS — F411 Generalized anxiety disorder: Secondary | ICD-10-CM

## 2022-09-02 DIAGNOSIS — F332 Major depressive disorder, recurrent severe without psychotic features: Secondary | ICD-10-CM

## 2022-09-02 NOTE — Progress Notes (Signed)
BH MD/PA/NP OP Progress Note  09/03/2022 9:05 AM Toni Atkinson  MRN:  [id number]  Chief Complaint:  Chief Complaint  Patient presents with   [place] Consult   Depression   HPI:  Toni Atkinson is a 51 yr old female who presents for [place] Consult.  PPHx is significant for MDD and Anxiety, one previous Suicide Attempt (OD) and hospitalization ([place]) in 2018, and no history of Self Injurious Behavior.  She reports that she has been getting more depressed for the last 1 to 2 months.  She states it started when she had to put her dog down and at first she thought this was grief from that however he continued to worsen to the point where she did not trust herself yesterday and so went to [facility].  She reports that she has had crying spells, found it hard to get out of bed, and has difficulty going to work.  She states she has been dealing with depression for over 25 years but that [place] was the first thing that actually helped her and resolved her symptoms.  She reports her treatment was done approximately 18 months ago.  She is currently on Prozac 60 mg and BuSpar 10 mg twice daily.  She reports past psychiatric history significant for MDD and anxiety.  She reports 1 suicide attempt via overdose approximately 5 years ago which did result in her only hospitalization at a facility in [place].  She reports no history of self-injurious behaviors.  She reports a significant family history with her sister and 45 maternal aunts and uncles having depression.  She does report 2 maternal cousins attempted suicide.  She reports past medical history significant for rheumatoid arthritis and migraines.  She reports past surgical history significant for laparoscopic mass removal in 2000, breast augmentation in 2001, and all 4 wisdom teeth removed.  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  She reports she currently lives in an apartment by herself.  She reports a graduated high school and did attend some  college.  She reports social alcohol use.  She reports no tobacco use.  She reports no substance abuse.  She reports no current or pending legal charges.  She reports no access to firearms.  She reports no history of head trauma or seizures.  She reports that while she sleeps about 7 hours a night it is very poor quality/nonrestful sleep.  She reports her appetite is poor.  Discussed [place] with her.  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  Discussed the first appointment and what is involved with finding the MT.  Discussed with her that afterwards treatment sessions would last approximately 20 minutes.  Discussed that treatments are Monday through Friday for 6 weeks and then there is a taper period.  Discussed that there is no definitive timeline for how long results would last but that typically we expect at least 1 year of improvement to consider additional treatments in the future.  Discussed that she should continue taking their medications as prescribed by their outpatient provider discussed that the tech would run the treatments afterwards with their parameters saved into the system, however, if any questions or issues arose I would be immediately available by phone or could be on site.  Discussed potential risks and side effects including but not limited to: Discomfort at site of magnet placement, headache, or seizure.  Confirmed that there was no implanted/retained metal in the head (aneurysm clips, plates, screws).  Confirmed  that there is no significant dental/bridge work.  Confirmed that there is no implanted pacemaker/defibrillator.  Discussed that jewelry should not be worn or removed from the ear for treatment.  Discussed that treatments are done at [facility]  Discussed that treatment room can have lights dimmed, music played, or have the TV on during treatment sessions for comfort.  All questions were invited and answered. She were agreeable to proceed with MT  appointment.  Confirmed with her she has no intent or plan to hurt herself.  She confirms her sister is coming to stay with her tomorrow (10/6).  She confirms she will contact someone if she has intensifying SI.  Discussed with her what to do in the event of a future crisis.  Discussed that she can return to [facility], go to [facility], go to the nearest ED, or call 911 or [phone].   She reported understanding and had no concerns.    Visit Diagnosis:    ICD-10-CM   1. MDD (major depressive disorder), recurrent severe, without psychosis (HCC)  F33.2     2. GAD (generalized anxiety disorder)  F41.1       Past Psychiatric History: MDD and Anxiety, one previous Suicide Attempt (OD) and hospitalization ([place]) in 2018, and no history of Self Injurious Behavior.  Past Medical History:  Past Medical History:  Diagnosis Date   Anxiety and depression    Migraine    RA (rheumatoid arthritis) (HCC)    RLS (restless legs syndrome)     Past Surgical History:  Procedure Laterality Date   laproscopic surgery mass on ovary  1991    Family Psychiatric History: Sister- Depression Maternal side 13 Aunts/Uncles with depression 2 Maternal Cousins- Attempted Suicide No Known Substance Abuse.  Family History: No family history on file.  Social History:  Social History   Socioeconomic History   Marital status: Single    Spouse name: Not on file   Number of children: Not on file   Years of education: Not on file   Highest education level: Not on file  Occupational History   Not on file  Tobacco Use   Smoking status: Never   Smokeless tobacco: Never  Substance and Sexual Activity   Alcohol use: Yes    Comment: socially   Drug use: Not Currently   Sexual activity: Not on file  Other Topics Concern   Not on file  Social History Narrative   Not on file   Social Determinants of Health   Financial Resource Strain: Not on file  Food Insecurity: Not on file  Transportation Needs: Not on file   Physical Activity: Not on file  Stress: Not on file  Social Connections: Not on file    Allergies: Not on File  Metabolic Disorder Labs: No results found for: "HGBA1C", "MPG" No results found for: "PROLACTIN" No results found for: "CHOL", "TRIG", "HDL", "CHOLHDL", "VLDL", "LDLCALC" No results found for: "TSH"  Therapeutic Level Labs: No results found for: "LITHIUM" No results found for: "VALPROATE" No results found for: "CBMZ"  Current Medications: Current Outpatient Medications  Medication Sig Dispense Refill   acyclovir (ZOVIRAX) 200 MG capsule Take 200 mg by mouth at bedtime.     busPIRone (BUSPAR) 10 MG tablet Take 10 mg by mouth 2 (two) times daily.     cyclobenzaprine (FLEXERIL) 10 MG tablet Take by mouth.     FLUoxetine (PROZAC) 20 MG capsule Take 60 mg by mouth every morning.     gabapentin (NEURONTIN) 300 MG capsule Take  1 capsule by mouth 3 (three) times daily. 1 cap in am, 1 at lunch and 4 at bedtime     Galcanezumab-gnlm (EMGALITY) 120 MG/ML SOAJ Inject 120 mg into the skin every 30 (thirty) days. 1.12 mL 5   rizatriptan (MAXALT) 10 MG tablet Take by mouth.     Tofacitinib Citrate ER (XELJANZ XR) 11 MG TB24 TAKE ONE TABLET BY MOUTH ONCE DAILY. MAY BE TAKEN WITH OR WITHOUT FOOD. SWALLOW TABLET WHOLE. DO NOT CRUSH, SPLIT OR CHEW. STORE AT ROOM TEMPERATURE.     No current facility-administered medications for this visit.     Musculoskeletal: Strength & Muscle Tone: within normal limits Gait & Station: normal Patient leans: N/A  Psychiatric Specialty Exam: Review of Systems  Respiratory:  Negative for shortness of breath.   Cardiovascular:  Negative for chest pain.  Gastrointestinal:  Negative for abdominal pain, constipation, diarrhea, nausea and vomiting.  Neurological:  Negative for dizziness, weakness and headaches.  Psychiatric/Behavioral:  Positive for dysphoric mood and sleep disturbance. Negative for hallucinations, self-injury and suicidal ideas. The  patient is nervous/anxious.     Blood pressure 137/80, pulse 96, height 5' 8.5" (1.74 m), weight 193 lb 9.6 oz (87.8 kg), SpO2 99 %.Body mass index is 29.01 kg/m.  General Appearance: Casual and Fairly Groomed  Eye Contact:  Fair  Speech:  Clear and Coherent and Normal Rate  Volume:  Normal  Mood:  Anxious and Depressed  Affect:  Congruent, Depressed, and Tearful  Thought Process:  Coherent and Goal Directed  Orientation:  Full (Time, Place, and Person)  Thought Content: WDL and Logical   Suicidal Thoughts:  Passive SI with No Intent or Plan,  lessened since yesterday.  Homicidal Thoughts:  No  Memory:  Immediate;   Good Recent;   Good  Judgement:  Good  Insight:  Good  Psychomotor Activity:  Normal  Concentration:  Concentration: Good and Attention Span: Good  Recall:  Good  Fund of Knowledge: Good  Language: Good  Akathisia:  Negative  Handed:  Right  AIMS (if indicated): not done  Assets:  Communication Skills Desire for Improvement Housing Resilience Social Support  ADL's:  Intact  Cognition: WNL  Sleep:  Poor 7 hours but not restful   Screenings: GAD-7    Flowsheet Row Clinical Support from 09/02/2022 in [facility]  Total GAD-7 Score 14      PHQ2-9    Flowsheet Row Clinical Support from 09/02/2022 in [facility] ED from 09/01/2022 in [facility]  PHQ-2 Total Score 4 4  PHQ-9 Total Score 20 15        Assessment and Plan:  She is a good candidate for TMS and since she had success with complete resolution of symptoms for approximately 16 months it would be appropriate to do another round of TMS.  She is interested in this and will await a call from our coordinator to arrange the MT appointment.  Collaboration of Care:   Patient/Guardian was advised Release of Information must be obtained prior to any record release in order to collaborate their care with an outside  provider. Patient/Guardian was advised if they have not already done so to contact the registration department to sign all necessary forms in order for [place] to release information regarding their care.   Consent: Patient/Guardian gives verbal consent for treatment and assignment of benefits for services provided during this visit. Patient/Guardian expressed understanding and agreed to proceed.  Lauro Franklin, MD 09/03/2022, 9:05 AM

## 2022-09-08 DIAGNOSIS — J302 Other seasonal allergic rhinitis: Secondary | ICD-10-CM | POA: Diagnosis not present

## 2022-09-08 DIAGNOSIS — J342 Deviated nasal septum: Secondary | ICD-10-CM | POA: Diagnosis not present

## 2022-09-08 DIAGNOSIS — G4733 Obstructive sleep apnea (adult) (pediatric): Secondary | ICD-10-CM | POA: Diagnosis not present

## 2022-09-08 DIAGNOSIS — J343 Hypertrophy of nasal turbinates: Secondary | ICD-10-CM | POA: Diagnosis not present

## 2022-09-10 DIAGNOSIS — M722 Plantar fascial fibromatosis: Secondary | ICD-10-CM | POA: Diagnosis not present

## 2022-09-10 DIAGNOSIS — S92101A Unspecified fracture of right talus, initial encounter for closed fracture: Secondary | ICD-10-CM | POA: Diagnosis not present

## 2022-09-12 DIAGNOSIS — R030 Elevated blood-pressure reading, without diagnosis of hypertension: Secondary | ICD-10-CM | POA: Diagnosis not present

## 2022-09-12 DIAGNOSIS — R051 Acute cough: Secondary | ICD-10-CM | POA: Diagnosis not present

## 2022-09-12 DIAGNOSIS — Z03818 Encounter for observation for suspected exposure to other biological agents ruled out: Secondary | ICD-10-CM | POA: Diagnosis not present

## 2022-09-14 ENCOUNTER — Telehealth (HOSPITAL_COMMUNITY): Payer: Self-pay | Admitting: Psychiatry

## 2022-09-14 NOTE — Telephone Encounter (Signed)
D:  On 09-02-22 the interim coordinator had contacted [employer] [place] to get authorization.  Was informed that no pre-cert was needed (ref # [id number]), but the team wanted the coordinator to verify that with the insurance company again.  On 09-06-22 @ 1445 placed a second call but the insurance company was closed d/t a holiday.  On 09-13-22 @ 1655 placed another call to [employer], spoke to a Beth who stated "PPO policies doesn't require a precert."  Ref# [id number].  A:  Placed call to inform pt that [facility] is a go, but pt states she will be going with [facility] instead.  Interim Coordinator apologized for the delay and not keeping the patient informed.  Encouraged pt to call the coordinator back if it doesn't work out with [employer].  Inform the [facility] Team.  R:  Pt receptive.

## 2022-09-16 DIAGNOSIS — M8430XA Stress fracture, unspecified site, initial encounter for fracture: Secondary | ICD-10-CM | POA: Diagnosis not present

## 2022-09-21 DIAGNOSIS — Z79899 Other long term (current) drug therapy: Secondary | ICD-10-CM | POA: Diagnosis not present

## 2022-09-21 DIAGNOSIS — F3341 Major depressive disorder, recurrent, in partial remission: Secondary | ICD-10-CM | POA: Diagnosis not present

## 2022-09-21 DIAGNOSIS — M810 Age-related osteoporosis without current pathological fracture: Secondary | ICD-10-CM | POA: Diagnosis not present

## 2022-09-21 DIAGNOSIS — M069 Rheumatoid arthritis, unspecified: Secondary | ICD-10-CM | POA: Diagnosis not present

## 2022-09-21 DIAGNOSIS — M8430XA Stress fracture, unspecified site, initial encounter for fracture: Secondary | ICD-10-CM | POA: Diagnosis not present

## 2022-09-22 DIAGNOSIS — F332 Major depressive disorder, recurrent severe without psychotic features: Secondary | ICD-10-CM | POA: Diagnosis not present

## 2022-09-23 DIAGNOSIS — F332 Major depressive disorder, recurrent severe without psychotic features: Secondary | ICD-10-CM | POA: Diagnosis not present

## 2022-09-24 DIAGNOSIS — F332 Major depressive disorder, recurrent severe without psychotic features: Secondary | ICD-10-CM | POA: Diagnosis not present

## 2022-09-24 DIAGNOSIS — G4733 Obstructive sleep apnea (adult) (pediatric): Secondary | ICD-10-CM | POA: Diagnosis not present

## 2022-09-26 DIAGNOSIS — G4733 Obstructive sleep apnea (adult) (pediatric): Secondary | ICD-10-CM | POA: Diagnosis not present

## 2022-09-27 DIAGNOSIS — F332 Major depressive disorder, recurrent severe without psychotic features: Secondary | ICD-10-CM | POA: Diagnosis not present

## 2022-09-28 DIAGNOSIS — F332 Major depressive disorder, recurrent severe without psychotic features: Secondary | ICD-10-CM | POA: Diagnosis not present

## 2022-09-29 DIAGNOSIS — F332 Major depressive disorder, recurrent severe without psychotic features: Secondary | ICD-10-CM | POA: Diagnosis not present

## 2022-09-30 DIAGNOSIS — F332 Major depressive disorder, recurrent severe without psychotic features: Secondary | ICD-10-CM | POA: Diagnosis not present

## 2022-10-01 DIAGNOSIS — F332 Major depressive disorder, recurrent severe without psychotic features: Secondary | ICD-10-CM | POA: Diagnosis not present

## 2022-10-04 DIAGNOSIS — M069 Rheumatoid arthritis, unspecified: Secondary | ICD-10-CM | POA: Diagnosis not present

## 2022-10-04 DIAGNOSIS — F332 Major depressive disorder, recurrent severe without psychotic features: Secondary | ICD-10-CM | POA: Diagnosis not present

## 2022-10-04 DIAGNOSIS — M25531 Pain in right wrist: Secondary | ICD-10-CM | POA: Diagnosis not present

## 2022-10-05 DIAGNOSIS — F332 Major depressive disorder, recurrent severe without psychotic features: Secondary | ICD-10-CM | POA: Diagnosis not present

## 2022-10-07 DIAGNOSIS — F332 Major depressive disorder, recurrent severe without psychotic features: Secondary | ICD-10-CM | POA: Diagnosis not present

## 2022-10-08 DIAGNOSIS — F332 Major depressive disorder, recurrent severe without psychotic features: Secondary | ICD-10-CM | POA: Diagnosis not present

## 2022-10-11 DIAGNOSIS — F332 Major depressive disorder, recurrent severe without psychotic features: Secondary | ICD-10-CM | POA: Diagnosis not present

## 2022-10-12 DIAGNOSIS — F332 Major depressive disorder, recurrent severe without psychotic features: Secondary | ICD-10-CM | POA: Diagnosis not present

## 2022-10-13 DIAGNOSIS — F332 Major depressive disorder, recurrent severe without psychotic features: Secondary | ICD-10-CM | POA: Diagnosis not present

## 2022-10-14 DIAGNOSIS — F332 Major depressive disorder, recurrent severe without psychotic features: Secondary | ICD-10-CM | POA: Diagnosis not present

## 2022-10-18 DIAGNOSIS — F332 Major depressive disorder, recurrent severe without psychotic features: Secondary | ICD-10-CM | POA: Diagnosis not present

## 2022-10-19 DIAGNOSIS — F332 Major depressive disorder, recurrent severe without psychotic features: Secondary | ICD-10-CM | POA: Diagnosis not present

## 2022-10-25 DIAGNOSIS — G4733 Obstructive sleep apnea (adult) (pediatric): Secondary | ICD-10-CM | POA: Diagnosis not present

## 2022-10-25 DIAGNOSIS — F332 Major depressive disorder, recurrent severe without psychotic features: Secondary | ICD-10-CM | POA: Diagnosis not present

## 2022-10-26 DIAGNOSIS — F332 Major depressive disorder, recurrent severe without psychotic features: Secondary | ICD-10-CM | POA: Diagnosis not present

## 2022-10-27 DIAGNOSIS — F332 Major depressive disorder, recurrent severe without psychotic features: Secondary | ICD-10-CM | POA: Diagnosis not present

## 2022-10-27 DIAGNOSIS — M25572 Pain in left ankle and joints of left foot: Secondary | ICD-10-CM | POA: Diagnosis not present

## 2022-10-28 DIAGNOSIS — F332 Major depressive disorder, recurrent severe without psychotic features: Secondary | ICD-10-CM | POA: Diagnosis not present

## 2022-10-29 DIAGNOSIS — F332 Major depressive disorder, recurrent severe without psychotic features: Secondary | ICD-10-CM | POA: Diagnosis not present

## 2022-10-31 DIAGNOSIS — M25572 Pain in left ankle and joints of left foot: Secondary | ICD-10-CM | POA: Diagnosis not present

## 2022-11-01 ENCOUNTER — Other Ambulatory Visit: Payer: Self-pay | Admitting: Otolaryngology

## 2022-11-01 DIAGNOSIS — F332 Major depressive disorder, recurrent severe without psychotic features: Secondary | ICD-10-CM | POA: Diagnosis not present

## 2022-11-02 ENCOUNTER — Encounter (HOSPITAL_BASED_OUTPATIENT_CLINIC_OR_DEPARTMENT_OTHER): Payer: Self-pay | Admitting: Otolaryngology

## 2022-11-02 ENCOUNTER — Other Ambulatory Visit: Payer: Self-pay

## 2022-11-02 DIAGNOSIS — F332 Major depressive disorder, recurrent severe without psychotic features: Secondary | ICD-10-CM | POA: Diagnosis not present

## 2022-11-03 DIAGNOSIS — F332 Major depressive disorder, recurrent severe without psychotic features: Secondary | ICD-10-CM | POA: Diagnosis not present

## 2022-11-04 DIAGNOSIS — S93492D Sprain of other ligament of left ankle, subsequent encounter: Secondary | ICD-10-CM | POA: Diagnosis not present

## 2022-11-04 DIAGNOSIS — M722 Plantar fascial fibromatosis: Secondary | ICD-10-CM | POA: Diagnosis not present

## 2022-11-04 DIAGNOSIS — S92101A Unspecified fracture of right talus, initial encounter for closed fracture: Secondary | ICD-10-CM | POA: Diagnosis not present

## 2022-11-04 DIAGNOSIS — F332 Major depressive disorder, recurrent severe without psychotic features: Secondary | ICD-10-CM | POA: Diagnosis not present

## 2022-11-04 DIAGNOSIS — M069 Rheumatoid arthritis, unspecified: Secondary | ICD-10-CM | POA: Diagnosis not present

## 2022-11-05 DIAGNOSIS — F332 Major depressive disorder, recurrent severe without psychotic features: Secondary | ICD-10-CM | POA: Diagnosis not present

## 2022-11-08 NOTE — Progress Notes (Signed)

## 2022-11-09 ENCOUNTER — Ambulatory Visit (HOSPITAL_BASED_OUTPATIENT_CLINIC_OR_DEPARTMENT_OTHER): Payer: BC Managed Care – PPO | Admitting: Anesthesiology

## 2022-11-09 ENCOUNTER — Encounter (HOSPITAL_BASED_OUTPATIENT_CLINIC_OR_DEPARTMENT_OTHER)
Admission: RE | Disposition: A | Discharge status: Home / Self Care | Payer: Self-pay | Source: Home / Self Care | Attending: Otolaryngology

## 2022-11-09 ENCOUNTER — Encounter (HOSPITAL_BASED_OUTPATIENT_CLINIC_OR_DEPARTMENT_OTHER): Payer: Self-pay | Admitting: Otolaryngology

## 2022-11-09 ENCOUNTER — Ambulatory Visit (HOSPITAL_BASED_OUTPATIENT_CLINIC_OR_DEPARTMENT_OTHER)
Admission: RE | Admit: 2022-11-09 | Discharge: 2022-11-09 | Disposition: A | Discharge status: Home / Self Care | Payer: BC Managed Care – PPO | Attending: Otolaryngology | Admitting: Otolaryngology

## 2022-11-09 ENCOUNTER — Other Ambulatory Visit: Payer: Self-pay

## 2022-11-09 DIAGNOSIS — G2581 Restless legs syndrome: Secondary | ICD-10-CM | POA: Diagnosis not present

## 2022-11-09 DIAGNOSIS — F419 Anxiety disorder, unspecified: Secondary | ICD-10-CM | POA: Insufficient documentation

## 2022-11-09 DIAGNOSIS — F418 Other specified anxiety disorders: Secondary | ICD-10-CM | POA: Diagnosis not present

## 2022-11-09 DIAGNOSIS — F32A Depression, unspecified: Secondary | ICD-10-CM | POA: Diagnosis not present

## 2022-11-09 DIAGNOSIS — G4733 Obstructive sleep apnea (adult) (pediatric): Secondary | ICD-10-CM | POA: Diagnosis not present

## 2022-11-09 DIAGNOSIS — M069 Rheumatoid arthritis, unspecified: Secondary | ICD-10-CM | POA: Diagnosis not present

## 2022-11-09 DIAGNOSIS — Z79622 Long term (current) use of janus kinase inhibitor: Secondary | ICD-10-CM | POA: Insufficient documentation

## 2022-11-09 DIAGNOSIS — Z79899 Other long term (current) drug therapy: Secondary | ICD-10-CM | POA: Insufficient documentation

## 2022-11-09 HISTORY — PX: DRUG INDUCED ENDOSCOPY: SHX6808

## 2022-11-09 HISTORY — DX: Age-related osteoporosis without current pathological fracture: M81.0

## 2022-11-09 SURGERY — DRUG INDUCED SLEEP ENDOSCOPY
Anesthesia: Monitor Anesthesia Care

## 2022-11-09 MED ORDER — PROPOFOL 500 MG/50ML IV EMUL
INTRAVENOUS | Status: DC | PRN
  Administered 2022-11-09: 35 ug/kg/min via INTRAVENOUS

## 2022-11-09 MED ORDER — ONDANSETRON HCL 4 MG/2ML IJ SOLN: 4.0000 mg | Freq: Once | INTRAMUSCULAR | Status: DC | PRN

## 2022-11-09 MED ORDER — LIDOCAINE 2% (20 MG/ML) 5 ML SYRINGE
INTRAMUSCULAR | Status: DC | PRN
  Administered 2022-11-09: 60 mg via INTRAVENOUS

## 2022-11-09 MED ORDER — LACTATED RINGERS IV SOLN: INTRAVENOUS | Status: DC

## 2022-11-09 MED ORDER — AMISULPRIDE (ANTIEMETIC) 5 MG/2ML IV SOLN: 10.0000 mg | Freq: Once | INTRAVENOUS | Status: DC | PRN

## 2022-11-09 SURGICAL SUPPLY — 12 items
CANISTER SUCT 1200ML W/VALVE (MISCELLANEOUS) ×1 IMPLANT
GLOVE BIOGEL M 7.0 STRL (GLOVE) ×1 IMPLANT
KIT CLEAN ENDO (MISCELLANEOUS) ×1 IMPLANT
NDL HYPO 27GX1-1/4 (NEEDLE) IMPLANT
NEEDLE HYPO 27GX1-1/4 (NEEDLE) IMPLANT
PATTIES SURGICAL .5 X3 (DISPOSABLE) IMPLANT
SHEET MEDIUM DRAPE 40X70 STRL (DRAPES) ×1 IMPLANT
SOL ANTI FOG 6CC (MISCELLANEOUS) ×1 IMPLANT
SPONGE NEURO XRAY DETECT 1X3 (DISPOSABLE) IMPLANT
SYR CONTROL 10ML LL (SYRINGE) IMPLANT
TOWEL GREEN STERILE FF (TOWEL DISPOSABLE) ×1 IMPLANT
TUBE CONNECTING 20X1/4 (TUBING) IMPLANT

## 2022-11-09 NOTE — Op Note (Signed)
Operative Note: DRUG INDUCED SLEEP ENDOSCOPY  Patient: Toni Atkinson  Medical record number: [id number]  Date:11/09/2022  Pre-operative Indications: 1.  Obstructive Sleep Apnea  Postoperative Indications: Same  Surgical Procedure: 1.  Drug Induced Sleep Endoscopy (DISE)  Anesthesia: MAC with IV sedation  Surgeon: Delsa Bern, M.D.  Complications: None  BMI: 30 kg/m  EBL: None  Findings: There is no evidence of complete concentric palatal obstruction.  Anatomically the patient should be a candidate for hypoglossal nerve stimulation therapy.     Brief History: The patient is a 51 y.o. female with a history of obstructive sleep apnea. The patient has undergone previous sleep study which showed mild to moderate levels of obstructive sleep apnea.  The patient was prescribed CPAP which they consistently attempted to use without success.  Given the patient's history and findings, the above drug-induced sleep endoscopy was recommended to assess the patient's anatomic level of apnea.  Risks and benefits were discussed in detail with the patient today understand and agree with our plan for surgery which is scheduled at [place] under sedated anesthesia as an outpatient.  Surgical Procedure: The patient is brought to the operating room on 11/09/2022 and placed in supine position on the operating table.  Intravenous sedated anesthesia was established without difficulty using the standard drug-induced sleep endoscopy protocol. When the patient was adequately anesthetized, surgical timeout was performed and correct identification of the patient and the surgical procedure.    A propofol infusion was administered and the patient was monitored carefully to achieve a level of sedation appropriate for DISE.  The patient did not respond to verbal commands but still had spontaneous respiration, sleep disordered breathing and associated desaturations were observed.  With the patient under adequate  sedated anesthesia the flexible nasal laryngoscope was passed without difficulty.  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  The endoscope was then passed to visualize the velopharynx, oropharynx, tongue base and epiglottis to assess areas of obstruction.  Patient's airway showed anterior to posterior obstruction involving the palate, uvula and base of tongue.   There was no evidence of complete concentric palatal obstruction and the patient appeared to be a candidate anatomically for hypoglossal nerve stimulation therapy.  Surgical sponge count was correct. Patient was awakened from anesthetic and transferred from the operating room to the recovery room in stable condition. There were no complications and no blood loss.   Delsa Bern, M.D. [facility] 11/09/2022

## 2022-11-09 NOTE — Anesthesia Preprocedure Evaluation (Signed)
Anesthesia Evaluation  Patient identified by MRN, date of birth, ID band Patient awake    Reviewed: Allergy & Precautions, NPO status , Patient's Chart, lab work & pertinent test results  History of Anesthesia Complications Negative for: history of anesthetic complications  Airway Mallampati: II  TM Distance: >3 FB Neck ROM: Full    Dental   Pulmonary sleep apnea    Pulmonary exam normal        Cardiovascular negative cardio ROS Normal cardiovascular exam     Neuro/Psych  Headaches  Anxiety Depression       GI/Hepatic negative GI ROS, Neg liver ROS,,,  Endo/Other  negative endocrine ROS    Renal/GU negative Renal ROS  negative genitourinary   Musculoskeletal  (+) Arthritis , Rheumatoid disorders,    Abdominal   Peds  Hematology negative hematology ROS (+)   Anesthesia Other Findings   Reproductive/Obstetrics                              Anesthesia Physical Anesthesia Plan  ASA: 2  Anesthesia Plan: MAC   Post-op Pain Management: Minimal or no pain anticipated   Induction: Intravenous  PONV Risk Score and Plan: 2 and Propofol infusion, TIVA and Treatment may vary due to age or medical condition  Airway Management Planned: Natural Airway, Nasal Cannula and Simple Face Mask  Additional Equipment: None  Intra-op Plan:   Post-operative Plan:   Informed Consent: I have reviewed the patients History and Physical, chart, labs and discussed the procedure including the risks, benefits and alternatives for the proposed anesthesia with the patient or authorized representative who has indicated his/her understanding and acceptance.       Plan Discussed with:   Anesthesia Plan Comments:          Anesthesia Quick Evaluation

## 2022-11-09 NOTE — Anesthesia Postprocedure Evaluation (Signed)
Anesthesia Post Note  Patient: Toni Atkinson  Procedure(s) Performed: DRUG INDUCED SLEEP ENDOSCOPY     Patient location during evaluation: PACU Anesthesia Type: MAC Level of consciousness: awake and alert Pain management: pain level controlled Vital Signs Assessment: post-procedure vital signs reviewed and stable Respiratory status: spontaneous breathing, nonlabored ventilation and respiratory function stable Cardiovascular status: blood pressure returned to baseline and stable Postop Assessment: no apparent nausea or vomiting Anesthetic complications: no   No notable events documented.  Last Vitals:  Vitals:   11/09/22 0819 11/09/22 0845  BP: 126/83 125/80  Pulse: 69 73  Resp: 14   Temp: 36.6 C 36.5 C  SpO2: 99% 98%    Last Pain:  Vitals:   11/09/22 0845  TempSrc:   PainSc: 0-No pain                 Lidia Collum

## 2022-11-09 NOTE — Discharge Instructions (Signed)

## 2022-11-09 NOTE — H&P (Signed)
Toni Atkinson is an 51 y.o. female.   Chief Complaint: Obstructive sleep apnea HPI: History of obstructive sleep apnea, unable to tolerate long-term CPAP treatment  Past Medical History:  Diagnosis Date   Anxiety and depression    Migraine    Osteoporosis    RA (rheumatoid arthritis) (HCC)    RLS (restless legs syndrome)     Past Surgical History:  Procedure Laterality Date   laproscopic surgery mass on ovary  1991    History reviewed. No pertinent family history. Social History:  reports that she has never smoked. She has never used smokeless tobacco. She reports current alcohol use. She reports that she does not currently use drugs.  Allergies:  Allergies  Allergen Reactions   Bactrim [Sulfamethoxazole-Trimethoprim] Other (See Comments)    Mouth blisters    Medications Prior to Admission  Medication Sig Dispense Refill   acyclovir (ZOVIRAX) 200 MG capsule Take 200 mg by mouth at bedtime.     alendronate (FOSAMAX) 70 MG tablet Take 70 mg by mouth once a week. Take with a full glass of water on an empty stomach.     busPIRone (BUSPAR) 10 MG tablet Take 10 mg by mouth 2 (two) times daily.     CALCIUM PO Take by mouth.     cyclobenzaprine (FLEXERIL) 10 MG tablet Take by mouth.     FLUoxetine (PROZAC) 20 MG capsule Take 60 mg by mouth every morning.     gabapentin (NEURONTIN) 300 MG capsule Take 1 capsule by mouth 3 (three) times daily. 1 cap in am, 1 at lunch and 4 at bedtime     rizatriptan (MAXALT) 10 MG tablet Take by mouth.     Tofacitinib Citrate ER (XELJANZ XR) 11 MG TB24 TAKE ONE TABLET BY MOUTH ONCE DAILY. MAY BE TAKEN WITH OR WITHOUT FOOD. SWALLOW TABLET WHOLE. DO NOT CRUSH, SPLIT OR CHEW. STORE AT ROOM TEMPERATURE.     VITAMIN D PO Take by mouth.     Galcanezumab-gnlm (EMGALITY) 120 MG/ML SOAJ Inject 120 mg into the skin every 30 (thirty) days. 1.12 mL 5    No results found for this or any previous visit (from the past 48 hour(s)). No results found.  Review of  Systems  Respiratory:  Positive for apnea.     Blood pressure 119/88, pulse 74, temperature 98.1 F (36.7 C), temperature source Oral, resp. rate 14, height 5\' 8"  (1.727 m), weight 89.5 kg, SpO2 99 %. Physical Exam Cardiovascular:     Rate and Rhythm: Normal rate.  Pulmonary:     Effort: Pulmonary effort is normal.  Musculoskeletal:     Cervical back: Normal range of motion.      Assessment/Plan Patient admitted for outpatient surgery under sedated anesthesia: Drug-induced sleep endoscopy.  , MD 11/09/2022, 7:48 AM

## 2022-11-09 NOTE — Transfer of Care (Signed)
Immediate Anesthesia Transfer of Care Note  Patient: Amel Catterton  Procedure(s) Performed: DRUG INDUCED SLEEP ENDOSCOPY  Patient Location: PACU  Anesthesia Type:MAC  Level of Consciousness: awake, alert , oriented, and patient cooperative  Airway & Oxygen Therapy: Patient Spontanous Breathing  Post-op Assessment: Report given to RN and Post -op Vital signs reviewed and stable  Post vital signs: Reviewed and stable  Last Vitals:  Vitals Value Taken Time  BP    Temp    Pulse 69 11/09/22 0813  Resp    SpO2 100 % 11/09/22 0813  Vitals shown include unvalidated device data.  Last Pain:  Vitals:   11/09/22 0720  TempSrc: Oral  PainSc: 0-No pain      Patients Stated Pain Goal: 0 (57/01/77 9390)  Complications: No notable events documented.

## 2022-11-09 NOTE — Anesthesia Procedure Notes (Signed)
Procedure Name: MAC Date/Time: 11/09/2022 8:10 AM  Performed by: Signe Colt, CRNAPre-anesthesia Checklist: Patient identified, Emergency Drugs available, Suction available, Patient being monitored and Timeout performed Patient Re-evaluated:Patient Re-evaluated prior to induction Oxygen Delivery Method: Simple face mask

## 2022-11-10 ENCOUNTER — Encounter (HOSPITAL_BASED_OUTPATIENT_CLINIC_OR_DEPARTMENT_OTHER): Payer: Self-pay | Admitting: Otolaryngology

## 2022-11-10 DIAGNOSIS — F332 Major depressive disorder, recurrent severe without psychotic features: Secondary | ICD-10-CM | POA: Diagnosis not present

## 2022-11-11 DIAGNOSIS — F332 Major depressive disorder, recurrent severe without psychotic features: Secondary | ICD-10-CM | POA: Diagnosis not present

## 2022-11-16 DIAGNOSIS — F332 Major depressive disorder, recurrent severe without psychotic features: Secondary | ICD-10-CM | POA: Diagnosis not present

## 2022-11-24 DIAGNOSIS — F332 Major depressive disorder, recurrent severe without psychotic features: Secondary | ICD-10-CM | POA: Diagnosis not present

## 2022-11-24 DIAGNOSIS — G4733 Obstructive sleep apnea (adult) (pediatric): Secondary | ICD-10-CM | POA: Diagnosis not present

## 2022-11-25 ENCOUNTER — Other Ambulatory Visit: Payer: Self-pay | Admitting: Otolaryngology

## 2022-11-26 DIAGNOSIS — F332 Major depressive disorder, recurrent severe without psychotic features: Secondary | ICD-10-CM | POA: Diagnosis not present

## 2022-11-30 ENCOUNTER — Other Ambulatory Visit: Payer: Self-pay | Admitting: Obstetrics and Gynecology

## 2022-11-30 DIAGNOSIS — Z1231 Encounter for screening mammogram for malignant neoplasm of breast: Secondary | ICD-10-CM

## 2022-12-01 DIAGNOSIS — F332 Major depressive disorder, recurrent severe without psychotic features: Secondary | ICD-10-CM | POA: Diagnosis not present

## 2022-12-02 DIAGNOSIS — F332 Major depressive disorder, recurrent severe without psychotic features: Secondary | ICD-10-CM | POA: Diagnosis not present

## 2022-12-02 DIAGNOSIS — R42 Dizziness and giddiness: Secondary | ICD-10-CM | POA: Diagnosis not present

## 2022-12-02 DIAGNOSIS — G43909 Migraine, unspecified, not intractable, without status migrainosus: Secondary | ICD-10-CM | POA: Diagnosis not present

## 2022-12-08 DIAGNOSIS — F332 Major depressive disorder, recurrent severe without psychotic features: Secondary | ICD-10-CM | POA: Diagnosis not present

## 2022-12-23 ENCOUNTER — Inpatient Hospital Stay: Admission: RE | Admit: 2022-12-23 | Payer: BC Managed Care – PPO | Source: Ambulatory Visit

## 2022-12-25 DIAGNOSIS — R5383 Other fatigue: Secondary | ICD-10-CM | POA: Diagnosis not present

## 2022-12-25 DIAGNOSIS — J069 Acute upper respiratory infection, unspecified: Secondary | ICD-10-CM | POA: Diagnosis not present

## 2022-12-25 DIAGNOSIS — Z03818 Encounter for observation for suspected exposure to other biological agents ruled out: Secondary | ICD-10-CM | POA: Diagnosis not present

## 2022-12-25 DIAGNOSIS — B974 Respiratory syncytial virus as the cause of diseases classified elsewhere: Secondary | ICD-10-CM | POA: Diagnosis not present

## 2022-12-25 DIAGNOSIS — R0981 Nasal congestion: Secondary | ICD-10-CM | POA: Diagnosis not present

## 2022-12-25 DIAGNOSIS — R051 Acute cough: Secondary | ICD-10-CM | POA: Diagnosis not present

## 2022-12-30 DIAGNOSIS — Z6828 Body mass index (BMI) 28.0-28.9, adult: Secondary | ICD-10-CM | POA: Diagnosis not present

## 2022-12-30 DIAGNOSIS — G4733 Obstructive sleep apnea (adult) (pediatric): Secondary | ICD-10-CM | POA: Diagnosis not present

## 2023-01-12 DIAGNOSIS — F332 Major depressive disorder, recurrent severe without psychotic features: Secondary | ICD-10-CM | POA: Diagnosis not present

## 2023-01-28 ENCOUNTER — Other Ambulatory Visit: Payer: Self-pay | Admitting: Obstetrics and Gynecology

## 2023-01-28 ENCOUNTER — Ambulatory Visit
Admission: RE | Admit: 2023-01-28 | Discharge: 2023-01-28 | Disposition: A | Discharge status: Home / Self Care | Payer: BC Managed Care – PPO | Source: Ambulatory Visit

## 2023-01-28 DIAGNOSIS — Z1231 Encounter for screening mammogram for malignant neoplasm of breast: Secondary | ICD-10-CM

## 2023-01-31 ENCOUNTER — Other Ambulatory Visit: Payer: Self-pay

## 2023-01-31 ENCOUNTER — Encounter (HOSPITAL_COMMUNITY): Payer: Self-pay | Admitting: Otolaryngology

## 2023-01-31 NOTE — Progress Notes (Signed)
PCP - Glenis Smoker, MD  CPAP - Cannot tolerate  Anesthesia review: N  Patient verbally denies any shortness of breath, fever, cough and chest pain during phone call   -------------  SDW INSTRUCTIONS given:  Your procedure is scheduled on 02/02/23.  Report to [facility] Main Entrance "A" at 0800 A.M., and check in at the Admitting office.  Call this number if you have problems the morning of surgery:  [phone]   Remember:  Do not eat after midnight the night before your surgery  You may drink clear liquids until 0730 the morning of your surgery.   Clear liquids allowed are: Water, Non-Citrus Juices (without pulp), Carbonated Beverages, Clear Tea, Black Coffee Only, and Gatorade    Take these medicines the morning of surgery with A SIP OF WATER  busPIRone (BUSPAR)  FLUoxetine (PROZAC)  fluticasone (FLONASE)  gabapentin (NEURONTIN)  NURTEC  omeprazole (PRILOSEC)  Tofacitinib Citrate ER (XELJANZ XR)   As of today, STOP taking any Aspirin (unless otherwise instructed by your surgeon) Aleve, Naproxen, Ibuprofen, Motrin, Advil, Goody's, BC's, all herbal medications, fish oil, and all vitamins.                      Do not wear jewelry, make up, or nail polish            Do not wear lotions, powders, perfumes/colognes, or deodorant.            Do not shave 48 hours prior to surgery.  Men may shave face and neck.            Do not bring valuables to the hospital.            [facility] is not responsible for any belongings or valuables.  Do NOT Smoke (Tobacco/Vaping) 24 hours prior to your procedure If you use a CPAP at night, you may bring all equipment for your overnight stay.   Contacts, glasses, dentures or bridgework may not be worn into surgery.      For patients admitted to the hospital, discharge time will be determined by your treatment team.   Patients discharged the day of surgery will not be allowed to drive home, and someone needs to stay with them for 24  hours.    Special instructions:   [place]- Preparing For Surgery  Before surgery, you can play an important role. Because skin is not sterile, your skin needs to be as free of germs as possible. You can reduce the number of germs on your skin by washing with CHG (chlorahexidine gluconate) Soap before surgery.  CHG is an antiseptic cleaner which kills germs and bonds with the skin to continue killing germs even after washing.    Oral Hygiene is also important to reduce your risk of infection.  Remember - BRUSH YOUR TEETH THE MORNING OF SURGERY WITH YOUR REGULAR TOOTHPASTE  Please do not use if you have an allergy to CHG or antibacterial soaps. If your skin becomes reddened/irritated stop using the CHG.  Do not shave (including legs and underarms) for at least 48 hours prior to first CHG shower. It is OK to shave your face.  Please follow these instructions carefully.   Shower the NIGHT BEFORE SURGERY and the MORNING OF SURGERY with DIAL Soap.   Pat yourself dry with a CLEAN TOWEL.  Wear CLEAN PAJAMAS to bed the night before surgery  Place CLEAN SHEETS on your bed the night of your first shower and  DO NOT SLEEP WITH PETS.   Day of Surgery: Please shower morning of surgery  Wear Clean/Comfortable clothing the morning of surgery Do not apply any deodorants/lotions.   Remember to brush your teeth WITH YOUR REGULAR TOOTHPASTE.   Questions were answered. Patient verbalized understanding of instructions.

## 2023-02-01 NOTE — Progress Notes (Signed)
Anesthesia Chart Review: Toni Atkinson  Case: [id number] Date/Time: 02/02/23 1020   Procedure: IMPLANTATION OF HYPOGLOSSAL NERVE STIMULATOR (Right)   Anesthesia type: General   Pre-op diagnosis: Obstructive sleep apnea   Location: [facility] OR ROOM 09 / [place] [facility]   Surgeons: Toni Quitter, MD       DISCUSSION: Patient is a 52 year old female scheduled for the above procedure. S/p drug induced sleep endoscopy 11/09/22.   History includes never Atkinson, RA, GERD, osteoporosis, OSA, RLS, migraines, laparoscopic ovarian surgery (1991), breast augmentation (2001).   RSV + 12/25/22 Toni Atkinson). She reported to RN that she has felt recovered for > 2 weeks.   She is a same day work-up. Anesthesia team to evaluate on the day of surgery.   PROVIDERS: Toni Smoker, MD is PCP    LABS: For day of surgery as indicated. As of 03/19/22 [facility] CE), Cr 0.79, glucose 86, H/H 12.6/38.9, WBC 7.4, PLT 425.    IMAGES: CXR 11/2722 ([facility]; [facility]/PACS): FINDINGS: The heart size and mediastinal contours are within normal limits. Both lungs are clear. The visualized skeletal structures are unremarkable. IMPRESSION: No active cardiopulmonary disease.    EKG: N/A   CV: N/A  Past Medical History:  Diagnosis Date   Anxiety and depression    GERD (gastroesophageal reflux disease)    Migraine    Osteoporosis    RA (rheumatoid arthritis) (HCC)    RLS (restless legs syndrome)    Sleep apnea     Past Surgical History:  Procedure Laterality Date   BREAST ENHANCEMENT SURGERY     2001   DRUG INDUCED ENDOSCOPY N/A 11/09/2022   Procedure: DRUG INDUCED SLEEP ENDOSCOPY;  Surgeon: Toni Belfast, MD;  Location: [place];  Service: ENT;  Laterality: N/A;   laproscopic surgery mass on ovary  1991    MEDICATIONS: No current facility-administered medications for this encounter.    acyclovir (ZOVIRAX) 200 MG capsule   alendronate (FOSAMAX) 70 MG tablet   busPIRone (BUSPAR) 10 MG tablet    CALCIUM PO   cholecalciferol 25 MCG (1000 UT) tablet   cyclobenzaprine (FLEXERIL) 10 MG tablet   FLUoxetine (PROZAC) 40 MG capsule   fluticasone (FLONASE) 50 MCG/ACT nasal spray   gabapentin (NEURONTIN) 300 MG capsule   NURTEC 75 MG TBDP   omeprazole (PRILOSEC) 20 MG capsule   Tofacitinib Citrate ER (XELJANZ XR) 11 MG TB24    Toni Gianotti, PA-C Surgical Short Stay/Anesthesiology [facility] Phone [phone] [facility] Phone [phone] 02/01/2023 12:41 PM

## 2023-02-01 NOTE — Anesthesia Preprocedure Evaluation (Addendum)
Anesthesia Evaluation  Patient identified by MRN, date of birth, ID band Patient awake    Reviewed: Allergy & Precautions, NPO status , Patient's Chart, lab work & pertinent test results  History of Anesthesia Complications Negative for: history of anesthetic complications  Airway Mallampati: I  TM Distance: >3 FB Neck ROM: Full    Dental  (+) Dental Advisory Given, Teeth Intact   Pulmonary neg shortness of breath, sleep apnea , neg COPD, Recent URI  (RSV 12/25/2022), Resolved   Pulmonary exam normal breath sounds clear to auscultation       Cardiovascular negative cardio ROS  Rhythm:Regular Rate:Normal     Neuro/Psych  Headaches, neg Seizures PSYCHIATRIC DISORDERS Anxiety Depression    RLS    GI/Hepatic Neg liver ROS,GERD  Medicated,,  Endo/Other  negative endocrine ROS    Renal/GU negative Renal ROS     Musculoskeletal  (+) Arthritis , Rheumatoid disorders,    Abdominal   Peds  Hematology negative hematology ROS (+)   Anesthesia Other Findings   Reproductive/Obstetrics                             Anesthesia Physical Anesthesia Plan  ASA: 2  Anesthesia Plan: General   Post-op Pain Management: Tylenol PO (pre-op)*   Induction: Intravenous  PONV Risk Score and Plan: 3 and Ondansetron, Dexamethasone and Treatment may vary due to age or medical condition  Airway Management Planned: Oral ETT  Additional Equipment:   Intra-op Plan:   Post-operative Plan: Extubation in OR  Informed Consent: I have reviewed the patients History and Physical, chart, labs and discussed the procedure including the risks, benefits and alternatives for the proposed anesthesia with the patient or authorized representative who has indicated his/her understanding and acceptance.     Dental advisory given  Plan Discussed with: CRNA and Anesthesiologist  Anesthesia Plan Comments: (PAT note written  02/01/2023 by [name], PA-C.  Risks of general anesthesia discussed including, but not limited to, sore throat, hoarse voice, chipped/damaged teeth, injury to vocal cords, nausea and vomiting, allergic reactions, lung infection, heart attack, stroke, and death. All questions answered.   )       Anesthesia Quick Evaluation

## 2023-02-01 NOTE — Progress Notes (Signed)
Patient was called to informed that the surgery time for tomorrow was changed from 10:35 to 09;30 o'clock. Unable to talk with the patient and this writer called patient's sister Lamount Cohen [phone]; I informed sister that the patient must be at the hospital tomorrow morning at 07:00 o'clock and she will stop clear liquids at 06:30 o'clock. Patient's sister verbalized understanding.

## 2023-02-02 ENCOUNTER — Ambulatory Visit (HOSPITAL_COMMUNITY)
Admission: RE | Admit: 2023-02-02 | Discharge: 2023-02-02 | Disposition: A | Discharge status: Home / Self Care | Payer: BC Managed Care – PPO | Attending: Otolaryngology | Admitting: Otolaryngology

## 2023-02-02 ENCOUNTER — Ambulatory Visit (HOSPITAL_COMMUNITY): Payer: BC Managed Care – PPO | Admitting: Vascular Surgery

## 2023-02-02 ENCOUNTER — Ambulatory Visit (HOSPITAL_COMMUNITY): Payer: BC Managed Care – PPO

## 2023-02-02 ENCOUNTER — Encounter (HOSPITAL_COMMUNITY): Payer: Self-pay | Admitting: Otolaryngology

## 2023-02-02 ENCOUNTER — Encounter (HOSPITAL_COMMUNITY)
Admission: RE | Disposition: A | Discharge status: Home / Self Care | Payer: Self-pay | Source: Home / Self Care | Attending: Otolaryngology

## 2023-02-02 ENCOUNTER — Other Ambulatory Visit: Payer: Self-pay

## 2023-02-02 DIAGNOSIS — G2581 Restless legs syndrome: Secondary | ICD-10-CM | POA: Insufficient documentation

## 2023-02-02 DIAGNOSIS — G4733 Obstructive sleep apnea (adult) (pediatric): Secondary | ICD-10-CM | POA: Diagnosis not present

## 2023-02-02 DIAGNOSIS — R519 Headache, unspecified: Secondary | ICD-10-CM | POA: Insufficient documentation

## 2023-02-02 DIAGNOSIS — M069 Rheumatoid arthritis, unspecified: Secondary | ICD-10-CM | POA: Diagnosis not present

## 2023-02-02 DIAGNOSIS — K219 Gastro-esophageal reflux disease without esophagitis: Secondary | ICD-10-CM | POA: Diagnosis not present

## 2023-02-02 DIAGNOSIS — F32A Depression, unspecified: Secondary | ICD-10-CM | POA: Diagnosis not present

## 2023-02-02 DIAGNOSIS — F419 Anxiety disorder, unspecified: Secondary | ICD-10-CM | POA: Insufficient documentation

## 2023-02-02 HISTORY — PX: IMPLANTATION OF HYPOGLOSSAL NERVE STIMULATOR: SHX6827

## 2023-02-02 HISTORY — DX: Sleep apnea, unspecified: G47.30

## 2023-02-02 HISTORY — DX: Gastro-esophageal reflux disease without esophagitis: K21.9

## 2023-02-02 LAB — CBC
HCT: 42.2 % (ref 36.0–46.0)
Hemoglobin: 14.2 g/dL (ref 12.0–15.0)
MCH: 29.2 pg (ref 26.0–34.0)
MCHC: 33.6 g/dL (ref 30.0–36.0)
MCV: 86.7 fL (ref 80.0–100.0)
Platelets: 427 10*3/uL — ABNORMAL HIGH (ref 150–400)
RBC: 4.87 MIL/uL (ref 3.87–5.11)
RDW: 15.4 % (ref 11.5–15.5)
WBC: 6.5 10*3/uL (ref 4.0–10.5)
nRBC: 0 % (ref 0.0–0.2)

## 2023-02-02 LAB — POCT PREGNANCY, URINE: Preg Test, Ur: NEGATIVE

## 2023-02-02 SURGERY — INSERTION, HYPOGLOSSAL NERVE STIMULATOR
Anesthesia: General | Site: Neck | Laterality: Right

## 2023-02-02 MED ORDER — ONDANSETRON HCL 4 MG/2ML IJ SOLN
INTRAMUSCULAR | Status: DC | PRN
  Administered 2023-02-02: 4 mg via INTRAVENOUS

## 2023-02-02 MED ORDER — CEFAZOLIN SODIUM-DEXTROSE 2-3 GM-%(50ML) IV SOLR
INTRAVENOUS | Status: DC | PRN
  Administered 2023-02-02: 2 g via INTRAVENOUS

## 2023-02-02 MED ORDER — PHENYLEPHRINE HCL-NACL 20-0.9 MG/250ML-% IV SOLN
INTRAVENOUS | Status: DC | PRN
  Administered 2023-02-02: 20 ug/min via INTRAVENOUS

## 2023-02-02 MED ORDER — MIDAZOLAM HCL 2 MG/2ML IJ SOLN
INTRAMUSCULAR | Status: AC
  Filled 2023-02-02: qty 2

## 2023-02-02 MED ORDER — OXYCODONE HCL 5 MG PO TABS
ORAL_TABLET | ORAL | Status: AC
  Filled 2023-02-02: qty 1

## 2023-02-02 MED ORDER — STERILE WATER FOR IRRIGATION IR SOLN
Status: DC | PRN
  Administered 2023-02-02: 1000 mL

## 2023-02-02 MED ORDER — CEFAZOLIN SODIUM 1 G IJ SOLR
INTRAMUSCULAR | Status: AC
  Filled 2023-02-02: qty 20

## 2023-02-02 MED ORDER — LIDOCAINE-EPINEPHRINE 1 %-1:100000 IJ SOLN
INTRAMUSCULAR | Status: DC | PRN
  Administered 2023-02-02: 5 mL

## 2023-02-02 MED ORDER — PROPOFOL 10 MG/ML IV BOLUS
INTRAVENOUS | Status: DC | PRN
  Administered 2023-02-02: 200 mg via INTRAVENOUS

## 2023-02-02 MED ORDER — OXYCODONE HCL 5 MG PO TABS
5.0000 mg | ORAL_TABLET | Freq: Once | ORAL | Status: AC | PRN
  Administered 2023-02-02: 5 mg via ORAL

## 2023-02-02 MED ORDER — MIDAZOLAM HCL 2 MG/2ML IJ SOLN
INTRAMUSCULAR | Status: DC | PRN
  Administered 2023-02-02: 2 mg via INTRAVENOUS

## 2023-02-02 MED ORDER — SUCCINYLCHOLINE CHLORIDE 200 MG/10ML IV SOSY
PREFILLED_SYRINGE | INTRAVENOUS | Status: DC | PRN
  Administered 2023-02-02: 100 mg via INTRAVENOUS

## 2023-02-02 MED ORDER — ORAL CARE MOUTH RINSE: 15.0000 mL | Freq: Once | OROMUCOSAL | Status: AC

## 2023-02-02 MED ORDER — FENTANYL CITRATE (PF) 100 MCG/2ML IJ SOLN
INTRAMUSCULAR | Status: AC
  Filled 2023-02-02: qty 2

## 2023-02-02 MED ORDER — OXYCODONE HCL 5 MG/5ML PO SOLN: 5.0000 mg | Freq: Once | ORAL | Status: AC | PRN

## 2023-02-02 MED ORDER — LIDOCAINE-EPINEPHRINE 1 %-1:100000 IJ SOLN
INTRAMUSCULAR | Status: AC
  Filled 2023-02-02: qty 1

## 2023-02-02 MED ORDER — 0.9 % SODIUM CHLORIDE (POUR BTL) OPTIME
TOPICAL | Status: DC | PRN
  Administered 2023-02-02: 1000 mL

## 2023-02-02 MED ORDER — FENTANYL CITRATE (PF) 250 MCG/5ML IJ SOLN
INTRAMUSCULAR | Status: AC
  Filled 2023-02-02: qty 5

## 2023-02-02 MED ORDER — PROPOFOL 500 MG/50ML IV EMUL
INTRAVENOUS | Status: DC | PRN
  Administered 2023-02-02: 50 ug/kg/min via INTRAVENOUS

## 2023-02-02 MED ORDER — FENTANYL CITRATE (PF) 100 MCG/2ML IJ SOLN
25.0000 ug | INTRAMUSCULAR | Status: DC | PRN
  Administered 2023-02-02 (×2): 50 ug via INTRAVENOUS

## 2023-02-02 MED ORDER — HYDROCODONE-ACETAMINOPHEN 5-325 MG PO TABS: 1.0000 | ORAL_TABLET | Freq: Four times a day (QID) | ORAL | 0 refills | Status: DC | PRN

## 2023-02-02 MED ORDER — ARTIFICIAL TEARS OPHTHALMIC OINT
TOPICAL_OINTMENT | OPHTHALMIC | Status: AC
  Filled 2023-02-02: qty 3.5

## 2023-02-02 MED ORDER — LACTATED RINGERS IV SOLN: INTRAVENOUS | Status: DC

## 2023-02-02 MED ORDER — CHLORHEXIDINE GLUCONATE 0.12 % MT SOLN
15.0000 mL | Freq: Once | OROMUCOSAL | Status: AC
  Administered 2023-02-02: 15 mL via OROMUCOSAL
  Filled 2023-02-02: qty 15

## 2023-02-02 MED ORDER — LIDOCAINE 2% (20 MG/ML) 5 ML SYRINGE
INTRAMUSCULAR | Status: DC | PRN
  Administered 2023-02-02: 100 mg via INTRAVENOUS

## 2023-02-02 MED ORDER — FENTANYL CITRATE (PF) 250 MCG/5ML IJ SOLN
INTRAMUSCULAR | Status: DC | PRN
  Administered 2023-02-02 (×2): 100 ug via INTRAVENOUS
  Administered 2023-02-02: 50 ug via INTRAVENOUS

## 2023-02-02 MED ORDER — PROMETHAZINE HCL 25 MG/ML IJ SOLN: 6.2500 mg | INTRAMUSCULAR | Status: DC | PRN

## 2023-02-02 MED ORDER — DEXAMETHASONE SODIUM PHOSPHATE 10 MG/ML IJ SOLN
INTRAMUSCULAR | Status: DC | PRN
  Administered 2023-02-02: 10 mg via INTRAVENOUS

## 2023-02-02 SURGICAL SUPPLY — 68 items
ACC NRSTM 4 TRQ WRNCH STRL (MISCELLANEOUS)
ADH SKN CLS APL DERMABOND .7 (GAUZE/BANDAGES/DRESSINGS) ×2
BAG COUNTER SPONGE SURGICOUNT (BAG) ×1 IMPLANT
BAG SPNG CNTER NS LX DISP (BAG) ×1
BLADE CLIPPER SURG (BLADE) IMPLANT
BLADE SURG 15 STRL LF DISP TIS (BLADE) ×3 IMPLANT
BLADE SURG 15 STRL SS (BLADE) ×1
CANISTER SUCT 3000ML PPV (MISCELLANEOUS) ×1 IMPLANT
CORD BIPOLAR FORCEPS 12FT (ELECTRODE) ×1 IMPLANT
COVER PROBE W GEL 5X96 (DRAPES) ×1 IMPLANT
COVER SURGICAL LIGHT HANDLE (MISCELLANEOUS) ×1 IMPLANT
DERMABOND ADVANCED .7 DNX12 (GAUZE/BANDAGES/DRESSINGS) ×2 IMPLANT
DRAPE C-ARM 35X43 STRL (DRAPES) ×1 IMPLANT
DRAPE HEAD BAR (DRAPES) ×1 IMPLANT
DRAPE INCISE IOBAN 66X45 STRL (DRAPES) ×1 IMPLANT
DRAPE MICROSCOPE LEICA 54X105 (DRAPES) ×1 IMPLANT
DRAPE UTILITY XL STRL (DRAPES) ×1 IMPLANT
DRSG TEGADERM 4X4.75 (GAUZE/BANDAGES/DRESSINGS) ×3 IMPLANT
ELECT COATED BLADE 2.86 ST (ELECTRODE) ×1 IMPLANT
ELECT EMG 18 NIMS (NEUROSURGERY SUPPLIES) ×1
ELECT REM PT RETURN 9FT ADLT (ELECTROSURGICAL) ×1
ELECTRODE EMG 18 NIMS (NEUROSURGERY SUPPLIES) ×1 IMPLANT
ELECTRODE REM PT RTRN 9FT ADLT (ELECTROSURGICAL) ×1 IMPLANT
FORCEPS BIPOLAR SPETZLER 8 1.0 (NEUROSURGERY SUPPLIES) ×1 IMPLANT
GAUZE 4X4 16PLY ~~LOC~~+RFID DBL (SPONGE) ×1 IMPLANT
GAUZE SPONGE 4X4 12PLY STRL (GAUZE/BANDAGES/DRESSINGS) ×1 IMPLANT
GENERATOR PULSE INSPIRE (Generator) ×1 IMPLANT
GENERATOR PULSE INSPIRE IV (Generator) ×1 IMPLANT
GLOVE BIO SURGEON STRL SZ 6.5 (GLOVE) IMPLANT
GLOVE BIO SURGEON STRL SZ7.5 (GLOVE) ×1 IMPLANT
GOWN STRL REUS W/ TWL LRG LVL3 (GOWN DISPOSABLE) ×3 IMPLANT
GOWN STRL REUS W/TWL LRG LVL3 (GOWN DISPOSABLE) ×4
KIT BASIN OR (CUSTOM PROCEDURE TRAY) ×1 IMPLANT
KIT NEURO ACCESSORY W/WRENCH (MISCELLANEOUS) IMPLANT
KIT TURNOVER KIT B (KITS) ×1 IMPLANT
LEAD SENSING RESP INSPIRE (Lead) ×1 IMPLANT
LEAD SENSING RESP INSPIRE IV (Lead) ×1 IMPLANT
LEAD SLEEP STIM INSPIRE IV/V (Lead) ×1 IMPLANT
LEAD SLEEP STIMULATION INSPIRE (Lead) ×1 IMPLANT
LOOP VESSEL MAXI BLUE (MISCELLANEOUS) ×1 IMPLANT
LOOP VESSEL MINI RED (MISCELLANEOUS) ×1 IMPLANT
MARKER SKIN DUAL TIP RULER LAB (MISCELLANEOUS) ×2 IMPLANT
NDL HYPO 25GX1X1/2 BEV (NEEDLE) ×1 IMPLANT
NEEDLE HYPO 25GX1X1/2 BEV (NEEDLE) ×1 IMPLANT
NS IRRIG 1000ML POUR BTL (IV SOLUTION) ×1 IMPLANT
PAD ARMBOARD 7.5X6 YLW CONV (MISCELLANEOUS) ×1 IMPLANT
PASSER CATH 38CM DISP (INSTRUMENTS) ×1 IMPLANT
PENCIL SMOKE EVACUATOR (MISCELLANEOUS) ×1 IMPLANT
POSITIONER HEAD DONUT 9IN (MISCELLANEOUS) ×1 IMPLANT
PROBE NERVE STIMULATOR (NEUROSURGERY SUPPLIES) ×1 IMPLANT
REMOTE CONTROL SLEEP INSPIRE (MISCELLANEOUS) ×1 IMPLANT
SET WALTER ACTIVATION W/DRAPE (SET/KITS/TRAYS/PACK) ×1 IMPLANT
SPONGE INTESTINAL PEANUT (DISPOSABLE) ×1 IMPLANT
STAPLER VISISTAT 35W (STAPLE) ×1 IMPLANT
SUT SILK 2 0 SH (SUTURE) ×1 IMPLANT
SUT SILK 3 0 REEL (SUTURE) ×1 IMPLANT
SUT SILK 3 0 SH 30 (SUTURE) ×2 IMPLANT
SUT SILK 3-0 (SUTURE) ×1
SUT SILK 3-0 RB1 30XBRD (SUTURE) ×1
SUT VIC AB 3-0 SH 27 (SUTURE) ×2
SUT VIC AB 3-0 SH 27X BRD (SUTURE) ×2 IMPLANT
SUT VIC AB 4-0 PS2 27 (SUTURE) ×2 IMPLANT
SUTURE SILK 3-0 RB1 30XBRD (SUTURE) ×1 IMPLANT
SYR 10ML LL (SYRINGE) ×1 IMPLANT
SYR BULB IRRIG 60ML STRL (SYRINGE) IMPLANT
TAPE CLOTH SURG 4X10 WHT LF (GAUZE/BANDAGES/DRESSINGS) ×1 IMPLANT
TOWEL GREEN STERILE (TOWEL DISPOSABLE) ×1 IMPLANT
TRAY ENT MC OR (CUSTOM PROCEDURE TRAY) ×1 IMPLANT

## 2023-02-02 NOTE — Anesthesia Postprocedure Evaluation (Signed)
Anesthesia Post Note  Patient: Toni Atkinson  Procedure(s) Performed: IMPLANTATION OF HYPOGLOSSAL NERVE STIMULATOR (Right: Neck)     Patient location during evaluation: PACU Anesthesia Type: General Level of consciousness: awake Pain management: pain level controlled Vital Signs Assessment: post-procedure vital signs reviewed and stable Respiratory status: spontaneous breathing, nonlabored ventilation and respiratory function stable Cardiovascular status: blood pressure returned to baseline and stable Postop Assessment: no apparent nausea or vomiting Anesthetic complications: no   No notable events documented.  Last Vitals:  Vitals:   02/02/23 1330 02/02/23 1345  BP: 134/80 117/76  Pulse: 92 89  Resp: 17 15  Temp:  36.6 C  SpO2: 97% 100%    Last Pain:  Vitals:   02/02/23 1345  PainSc: 3                  Nilda Simmer

## 2023-02-02 NOTE — H&P (Signed)
Toni Atkinson is an 52 y.o. female.   Chief Complaint: Sleep apnea HPI: 52 year old female with obstructive sleep apnea who has not been able to tolerate CPAP.  Past Medical History:  Diagnosis Date   Anxiety and depression    GERD (gastroesophageal reflux disease)    Migraine    Osteoporosis    RA (rheumatoid arthritis) (HCC)    RLS (restless legs syndrome)    Sleep apnea     Past Surgical History:  Procedure Laterality Date   BREAST ENHANCEMENT SURGERY     2001   DRUG INDUCED ENDOSCOPY N/A 11/09/2022   Procedure: DRUG INDUCED SLEEP ENDOSCOPY;  Surgeon: Jerrell Belfast, MD;  Location: [place];  Service: ENT;  Laterality: N/A;   laproscopic surgery mass on ovary  1991    Family History  Problem Relation Age of Onset   Breast cancer Maternal Aunt    Social History:  reports that she has never smoked. She has never used smokeless tobacco. She reports current alcohol use. She reports that she does not currently use drugs.  Allergies:  Allergies  Allergen Reactions   Bactrim [Sulfamethoxazole-Trimethoprim] Other (See Comments)    Mouth blisters   Bupropion Rash    Other Reaction(s): dry mouth    Medications Prior to Admission  Medication Sig Dispense Refill   acyclovir (ZOVIRAX) 200 MG capsule Take 200 mg by mouth at bedtime.     alendronate (FOSAMAX) 70 MG tablet Take 70 mg by mouth once a week. Take with a full glass of water on an empty stomach.     busPIRone (BUSPAR) 10 MG tablet Take 10 mg by mouth 2 (two) times daily.     CALCIUM PO Take 500 mg by mouth 2 (two) times daily.     cholecalciferol 25 MCG (1000 UT) tablet Take 1,000 Units by mouth 2 (two) times daily.     cyclobenzaprine (FLEXERIL) 10 MG tablet Take 10 mg by mouth at bedtime.     FLUoxetine (PROZAC) 40 MG capsule Take 40 mg by mouth every morning.     fluticasone (FLONASE) 50 MCG/ACT nasal spray Place 1 spray into both nostrils daily.     gabapentin (NEURONTIN) 300 MG capsule Take  300-1,200 mg by mouth See admin instructions. Take 300 mg in the morning and the afternoon and 1200 mg at bedtime     NURTEC 75 MG TBDP Take 75 mg by mouth daily as needed (migraine).     omeprazole (PRILOSEC) 20 MG capsule Take 20 mg by mouth daily.     Tofacitinib Citrate ER (XELJANZ XR) 11 MG TB24 TAKE ONE TABLET BY MOUTH ONCE DAILY. MAY BE TAKEN WITH OR WITHOUT FOOD. SWALLOW TABLET WHOLE. DO NOT CRUSH, SPLIT OR CHEW. STORE AT ROOM TEMPERATURE.      Results for orders placed or performed during the hospital encounter of 02/02/23 (from the past 48 hour(s))  CBC per protocol     Status: Abnormal   Collection Time: 02/02/23  7:40 AM  Result Value Ref Range   WBC 6.5 4.0 - 10.5 K/uL   RBC 4.87 3.87 - 5.11 MIL/uL   Hemoglobin 14.2 12.0 - 15.0 g/dL   HCT 42.2 36.0 - 46.0 %   MCV 86.7 80.0 - 100.0 fL   MCH 29.2 26.0 - 34.0 pg   MCHC 33.6 30.0 - 36.0 g/dL   RDW 15.4 11.5 - 15.5 %   Platelets 427 (H) 150 - 400 K/uL    Comment: REPEATED TO VERIFY  nRBC 0.0 0.0 - 0.2 %    Comment: Performed at [facility], [place] [address] [place], [place] [address]  Pregnancy, urine POC     Status: None   Collection Time: 02/02/23  8:02 AM  Result Value Ref Range   Preg Test, Ur NEGATIVE NEGATIVE    Comment:        THE SENSITIVITY OF THIS METHODOLOGY IS >24 mIU/mL    No results found.  Review of Systems  All other systems reviewed and are negative.   Blood pressure 123/76, pulse 83, temperature 98 F (36.7 C), resp. rate 18, height '5\' 8"'$  (1.727 m), weight 81.6 kg, SpO2 98 %. Physical Exam Constitutional:      Appearance: Normal appearance. She is normal weight.  HENT:     Head: Normocephalic and atraumatic.     Right Ear: External ear normal.     Left Ear: External ear normal.     Nose: Nose normal.     Mouth/Throat:     Mouth: Mucous membranes are moist.     Pharynx: Oropharynx is clear.  Eyes:     Extraocular Movements: Extraocular movements intact.     Conjunctiva/sclera:  Conjunctivae normal.     Pupils: Pupils are equal, round, and reactive to light.  Cardiovascular:     Rate and Rhythm: Normal rate.  Pulmonary:     Effort: Pulmonary effort is normal.  Musculoskeletal:     Cervical back: Normal range of motion.  Skin:    General: Skin is warm and dry.  Neurological:     General: No focal deficit present.     Mental Status: She is alert and oriented to person, place, and time.  Psychiatric:        Mood and Affect: Mood normal.        Behavior: Behavior normal.        Thought Content: Thought content normal.        Judgment: Judgment normal.      Assessment/Plan Obstructive sleep apnea and BMI 27.37.  To OR for hypoglossal nerve stimulator placement.  Melida Quitter, MD 02/02/2023, 9:18 AM

## 2023-02-02 NOTE — Brief Op Note (Signed)
02/02/2023  12:29 PM  PATIENT:  Toni Atkinson  52 y.o. female  PRE-OPERATIVE DIAGNOSIS:  Obstructive sleep apnea  POST-OPERATIVE DIAGNOSIS:  Obstructive sleep apnea  PROCEDURE:  Procedure(s): IMPLANTATION OF HYPOGLOSSAL NERVE STIMULATOR (Right)  SURGEON:  Surgeon(s) and Role:    Melida Quitter, MD - Primary  PHYSICIAN ASSISTANT:   ASSISTANTS: RNFA   ANESTHESIA:   general  EBL:  Minimal   BLOOD ADMINISTERED:none  DRAINS: none   LOCAL MEDICATIONS USED:  LIDOCAINE   SPECIMEN:  No Specimen  DISPOSITION OF SPECIMEN:  N/A  COUNTS:  YES  TOURNIQUET:  * No tourniquets in log *  DICTATION: .Note written in EPIC  PLAN OF CARE: Discharge to home after PACU  PATIENT DISPOSITION:  PACU - hemodynamically stable.   Delay start of Pharmacological VTE agent (>24hrs) due to surgical blood loss or risk of bleeding: no

## 2023-02-02 NOTE — Anesthesia Procedure Notes (Addendum)
Procedure Name: Intubation Date/Time: 02/02/2023 10:23 AM  Performed by: Darletta Moll, CRNAPre-anesthesia Checklist: Patient identified, Emergency Drugs available, Suction available and Patient being monitored Patient Re-evaluated:Patient Re-evaluated prior to induction Oxygen Delivery Method: Circle system utilized Preoxygenation: Pre-oxygenation with 100% oxygen Induction Type: IV induction Ventilation: Mask ventilation without difficulty Laryngoscope Size: Mac and 3 Grade View: Grade I Tube type: Oral Tube size: 7.0 mm Number of attempts: 1 Airway Equipment and Method: Stylet and Oral airway Placement Confirmation: ETT inserted through vocal cords under direct vision, positive ETCO2 and breath sounds checked- equal and bilateral Secured at: 22 cm Tube secured with: Tape Dental Injury: Teeth and Oropharynx as per pre-operative assessment

## 2023-02-02 NOTE — Transfer of Care (Signed)
Immediate Anesthesia Transfer of Care Note  Patient: Toni Atkinson  Procedure(s) Performed: IMPLANTATION OF HYPOGLOSSAL NERVE STIMULATOR (Right: Neck)  Patient Location: PACU  Anesthesia Type:General  Level of Consciousness: drowsy and patient cooperative  Airway & Oxygen Therapy: Patient Spontanous Breathing  Post-op Assessment: Report given to RN, Post -op Vital signs reviewed and stable, and Patient moving all extremities  Post vital signs: Reviewed and stable  Last Vitals:  Vitals Value Taken Time  BP 144/67   Temp    Pulse 109   Resp 12   SpO2 100     Last Pain:  Vitals:   02/02/23 0751  PainSc: 0-No pain         Complications: No notable events documented.

## 2023-02-02 NOTE — Op Note (Signed)

## 2023-02-03 ENCOUNTER — Encounter (HOSPITAL_COMMUNITY): Payer: Self-pay | Admitting: Otolaryngology

## 2023-02-10 DIAGNOSIS — G4733 Obstructive sleep apnea (adult) (pediatric): Secondary | ICD-10-CM | POA: Diagnosis not present

## 2023-02-15 DIAGNOSIS — M069 Rheumatoid arthritis, unspecified: Secondary | ICD-10-CM | POA: Diagnosis not present

## 2023-02-15 DIAGNOSIS — D849 Immunodeficiency, unspecified: Secondary | ICD-10-CM | POA: Diagnosis not present

## 2023-02-15 DIAGNOSIS — Z79899 Other long term (current) drug therapy: Secondary | ICD-10-CM | POA: Diagnosis not present

## 2023-02-15 DIAGNOSIS — M810 Age-related osteoporosis without current pathological fracture: Secondary | ICD-10-CM | POA: Diagnosis not present

## 2023-02-15 DIAGNOSIS — Z1231 Encounter for screening mammogram for malignant neoplasm of breast: Secondary | ICD-10-CM

## 2023-02-28 DIAGNOSIS — Z03818 Encounter for observation for suspected exposure to other biological agents ruled out: Secondary | ICD-10-CM | POA: Diagnosis not present

## 2023-02-28 DIAGNOSIS — R059 Cough, unspecified: Secondary | ICD-10-CM | POA: Diagnosis not present

## 2023-03-04 ENCOUNTER — Ambulatory Visit
Admission: RE | Admit: 2023-03-04 | Discharge: 2023-03-04 | Disposition: A | Discharge status: Home / Self Care | Payer: BC Managed Care – PPO | Source: Ambulatory Visit | Attending: Family Medicine | Admitting: Family Medicine

## 2023-03-04 ENCOUNTER — Other Ambulatory Visit: Payer: Self-pay | Admitting: Family Medicine

## 2023-03-04 DIAGNOSIS — R059 Cough, unspecified: Secondary | ICD-10-CM | POA: Diagnosis not present

## 2023-03-04 DIAGNOSIS — R0602 Shortness of breath: Secondary | ICD-10-CM | POA: Diagnosis not present

## 2023-03-08 ENCOUNTER — Ambulatory Visit (INDEPENDENT_AMBULATORY_CARE_PROVIDER_SITE_OTHER): Payer: BC Managed Care – PPO | Admitting: Adult Health

## 2023-03-08 ENCOUNTER — Encounter: Payer: Self-pay | Admitting: Adult Health

## 2023-03-08 VITALS — BP 120/86 | HR 102 | Temp 98.1°F | Ht 68.5 in | Wt 186.2 lb

## 2023-03-08 DIAGNOSIS — G4733 Obstructive sleep apnea (adult) (pediatric): Secondary | ICD-10-CM

## 2023-03-08 NOTE — Patient Instructions (Addendum)
Inspire titration weekly as discussed, on Tuesdays.  Healthy sleep regimen  Do not drive if sleepy  Follow up in 4 weeks and  As needed

## 2023-03-08 NOTE — Assessment & Plan Note (Signed)
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  She is status post inspire implantation completed on February 02, 2023.  Incision site has healed nicely.  Today in the office began activation phase.  Stimulation sensation at 1.2 V and functional level 1.4 V.  She will be a range of 1.4 to 2.4 V.  Start delay is at 60 minutes.  Pause time will be 15 minutes and duration will be 9 hours.  Remote control was set up.  Patient did well with return to demonstration.  She will begin weekly titrations on Tuesday.  Follow-up in 4 weeks.  Patient education given.  Plan  Patient Instructions  Inspire titration weekly as discussed, on Tuesdays.  Healthy sleep regimen  Do not drive if sleepy  Follow up in 4 weeks and  As needed

## 2023-03-08 NOTE — Progress Notes (Signed)
@Patient  ID: Toni Atkinson, female    DOB: [birth year], 52 y.o.   MRN: [id number]  Chief Complaint  Patient presents with   Consult    Referring provider: Shon Hale, *  HPI: 52 yo female seen for sleep consult 03/08/23 to establish for Sleep apnea s/p Inspire implantation.  Medical history significant for Rheumatoid Arthritis   TEST/EVENTS :  NPSG 12/02/20 AHI 17/hr, SpO2 85%  03/08/2023 Sleep consult  Patient presents for a sleep consult to establish for moderate sleep apnnea s/p Inspire implantation done on 02/02/23. Kindly referred by ENT , Dr. Jenne Pane.  Patient was diagnosed with sleep apnea in January 2022. Seen by neurology for headaches. Sleep study done on 12/02/20 that showed moderate OSA with AHI 17/hr, SPo2 low at 85%. She was started on CPAP . Tried to get used to CPAP but was intolerant. She was seen by ENT 09/08/22 for evaluation for hypoglossal nerve stimulator.  Underwent drug induced sleep endoscopy on 10/2022 that showed anterior to posterior obstruction, candidate for Inspire. She underwent implantation on 02/02/23. Has done well since surgery. Incision site has healed well. Denies tongue or throat pain. Inspire team is here today to assist with activation.  Stimulation with sensation level at 1.2 V and functional level at 1.4 V.  Patient will be on a range of 1.4-2.4.  Start delay will be 60 minutes.  Pause time will be 15 minutes ,  duration will be 9 hours.  Remote set up was completed with patient with return demonstration.  Patient typically goes to bed about 9 PM.  Takes a few minutes to go to sleep.  Is up at 5:30 AM.  She does not nap.  Does not take any sleep aids.  Rarely has taken melatonin in the past.  Drinks 1 cup of coffee daily.  No history of congestive heart failure or stroke.  No symptoms suspicious for cataplexy or sleep paralysis.  Has very loud snoring and daytime sleepiness.  She has no removable dental work.  She does have a history of restless leg and is  on gabapentin.  Epworth score is 3 out of 24.  Typically gets sleepy if she sits down to watch TV and in the evening hours.  Medical history significant for restless leg, migraines, osteoporosis, anxiety depression, GERD, sleep apnea  Social history patient is divorced.  Lives alone.  Works in Engineering geologist.  Has an adult child.  She is a never smoker.  No alcohol or drug use.  Family history positive for breast cancer, osteoarthritis and depression.  Allergies  Allergen Reactions   Bactrim [Sulfamethoxazole-Trimethoprim] Other (See Comments)    Mouth blisters   Bupropion Rash    Other Reaction(s): dry mouth    Immunization History  Administered Date(s) Administered   PFIZER(Purple Top)SARS-COV-2 Vaccination 12/12/2020, 01/02/2021   PNEUMOCOCCAL CONJUGATE-20 03/19/2022    Past Medical History:  Diagnosis Date   Anxiety and depression    GERD (gastroesophageal reflux disease)    Migraine    Osteoporosis    RA (rheumatoid arthritis)    RLS (restless legs syndrome)    Sleep apnea     Tobacco History: Social History   Tobacco Use  Smoking Status Never  Smokeless Tobacco Never   Counseling given: Not Answered   Outpatient Medications Prior to Visit  Medication Sig Dispense Refill   acyclovir (ZOVIRAX) 200 MG capsule Take 200 mg by mouth at bedtime.     alendronate (FOSAMAX) 70 MG tablet Take 70 mg by mouth once  a week. Take with a full glass of water on an empty stomach.     busPIRone (BUSPAR) 10 MG tablet Take 10 mg by mouth 2 (two) times daily.     CALCIUM PO Take 500 mg by mouth 2 (two) times daily.     cholecalciferol 25 MCG (1000 UT) tablet Take 1,000 Units by mouth 2 (two) times daily.     cyclobenzaprine (FLEXERIL) 10 MG tablet Take 10 mg by mouth at bedtime.     FLUoxetine (PROZAC) 40 MG capsule Take 40 mg by mouth every morning.     fluticasone (FLONASE) 50 MCG/ACT nasal spray Place 1 spray into both nostrils daily.     gabapentin (NEURONTIN) 300 MG capsule Take  300-1,200 mg by mouth See admin instructions. Take 300 mg in the morning and the afternoon and 1200 mg at bedtime     NURTEC 75 MG TBDP Take 75 mg by mouth daily as needed (migraine).     omeprazole (PRILOSEC) 20 MG capsule Take 20 mg by mouth daily.     Tofacitinib Citrate ER (XELJANZ XR) 11 MG TB24 TAKE ONE TABLET BY MOUTH ONCE DAILY. MAY BE TAKEN WITH OR WITHOUT FOOD. SWALLOW TABLET WHOLE. DO NOT CRUSH, SPLIT OR CHEW. STORE AT ROOM TEMPERATURE.     HYDROcodone-acetaminophen (NORCO) 5-325 MG tablet Take 1-2 tablets by mouth every 6 (six) hours as needed for moderate pain. 12 tablet 0   No facility-administered medications prior to visit.     Review of Systems:   Constitutional:   No  weight loss, night sweats,  Fevers, chills, + fatigue, or  lassitude.  HEENT:   No headaches,  Difficulty swallowing,  Tooth/dental problems, or  Sore throat,                No sneezing, itching, ear ache, nasal congestion, post nasal drip,   CV:  No chest pain,  Orthopnea, PND, swelling in lower extremities, anasarca, dizziness, palpitations, syncope.   GI  No heartburn, indigestion, abdominal pain, nausea, vomiting, diarrhea, change in bowel habits, loss of appetite, bloody stools.   Resp: No shortness of breath with exertion or at rest.  No excess mucus, no productive cough,  No non-productive cough,  No coughing up of blood.  No change in color of mucus.  No wheezing.  No chest wall deformity  Skin: no rash or lesions.  GU: no dysuria, change in color of urine, no urgency or frequency.  No flank pain, no hematuria   MS:  No joint pain or swelling.  No decreased range of motion.  No back pain.    Physical Exam  BP 120/86 (BP Location: Left Arm, Patient Position: Sitting, Cuff Size: Normal)   Pulse (!) 102   Temp 98.1 F (36.7 C) (Oral)   Ht 5' 8.5" (1.74 m)   Wt 186 lb 3.2 oz (84.5 kg)   SpO2 97%   BMI 27.90 kg/m   GEN: A/Ox3; pleasant , NAD, well nourished    HEENT:  [place]/AT, NOSE-clear,  THROAT-clear, no lesions, no postnasal drip or exudate noted.  Class II-III MP airway  NECK:  Supple w/ fair ROM; no JVD; normal carotid impulses w/o bruits; no thyromegaly or nodules palpated; no lymphadenopathy.    RESP  Clear  P & A; w/o, wheezes/ rales/ or rhonchi. no accessory muscle use, no dullness to percussion Chest wall incision and upper neck incision well-healed.  CARD:  RRR, no m/r/g, no peripheral edema, pulses intact, no cyanosis or clubbing.  GI:   Soft & nt; nml bowel sounds; no organomegaly or masses detected.   Musco: Warm bil, no deformities or joint swelling noted.   Neuro: alert, no focal deficits noted.    Skin: Warm, no lesions or rashes    Lab Results:  CBC    Component Value Date/Time   WBC 6.5 02/02/2023 0740   RBC 4.87 02/02/2023 0740   HGB 14.2 02/02/2023 0740   HCT 42.2 02/02/2023 0740   PLT 427 (H) 02/02/2023 0740   MCV 86.7 02/02/2023 0740   MCH 29.2 02/02/2023 0740   MCHC 33.6 02/02/2023 0740   RDW 15.4 02/02/2023 0740    BMET No results found for: "NA", "K", "CL", "CO2", "GLUCOSE", "BUN", "CREATININE", "CALCIUM", "GFRNONAA", "GFRAA"  BNP No results found for: "BNP"  ProBNP No results found for: "PROBNP"  Imaging: DG Chest 2 View  Result Date: 03/04/2023 CLINICAL DATA:  Shortness of breath, cough EXAM: CHEST - 2 VIEW COMPARISON:  02/02/2023 FINDINGS: Cardiac size is within normal limits. There are no signs of pulmonary edema or focal pulmonary consolidation. Small transverse linear density in the lateral aspect of left lower lung field may suggest minimal scarring or subsegmental atelectasis. There is no pleural effusion or pneumothorax. These an electronic device, possibly neurostimulator battery in the right anterior chest wall. Leads are extending to the anterior pleural space and right upper lung field and to the neck. IMPRESSION: There are no signs of pulmonary edema or focal pulmonary consolidation. Electronically Signed   By:  Ernie Avena M.D.   On: 03/04/2023 14:34          No data to display          No results found for: "NITRICOXIDE"      Assessment & Plan:   Obstructive sleep apnea Moderate obstructive sleep apnea with CPAP intolerance.  She is status post inspire implantation completed on February 02, 2023.  Incision site has healed nicely.  Today in the office began activation phase.  Stimulation sensation at 1.2 V and functional level 1.4 V.  She will be a range of 1.4 to 2.4 V.  Start delay is at 60 minutes.  Pause time will be 15 minutes and duration will be 9 hours.  Remote control was set up.  Patient did well with return to demonstration.  She will begin weekly titrations on Tuesday.  Follow-up in 4 weeks.  Patient education given.  Plan  Patient Instructions  Inspire titration weekly as discussed, on Tuesdays.  Healthy sleep regimen  Do not drive if sleepy  Follow up in 4 weeks and  As needed         I spent   45 minutes dedicated to the care of this patient on the date of this encounter to include pre-visit review of records, face-to-face time with the patient discussing conditions above, post visit ordering of testing, clinical documentation with the electronic health record, making appropriate referrals as documented, and communicating necessary findings to members of the patients care team.   Rubye Oaks, NP 03/08/2023

## 2023-03-09 ENCOUNTER — Emergency Department (HOSPITAL_COMMUNITY)
Admission: EM | Admit: 2023-03-09 | Discharge: 2023-03-10 | Disposition: A | Discharge status: Home / Self Care | Payer: BC Managed Care – PPO | Attending: Emergency Medicine | Admitting: Emergency Medicine

## 2023-03-09 ENCOUNTER — Other Ambulatory Visit: Payer: Self-pay

## 2023-03-09 ENCOUNTER — Encounter (HOSPITAL_COMMUNITY): Payer: Self-pay

## 2023-03-09 ENCOUNTER — Emergency Department (HOSPITAL_COMMUNITY): Payer: BC Managed Care – PPO

## 2023-03-09 DIAGNOSIS — R0602 Shortness of breath: Secondary | ICD-10-CM | POA: Diagnosis not present

## 2023-03-09 DIAGNOSIS — R0789 Other chest pain: Secondary | ICD-10-CM | POA: Diagnosis not present

## 2023-03-09 DIAGNOSIS — R059 Cough, unspecified: Secondary | ICD-10-CM | POA: Insufficient documentation

## 2023-03-09 DIAGNOSIS — J9811 Atelectasis: Secondary | ICD-10-CM | POA: Diagnosis not present

## 2023-03-09 DIAGNOSIS — Z1152 Encounter for screening for COVID-19: Secondary | ICD-10-CM | POA: Insufficient documentation

## 2023-03-09 LAB — CBC WITH DIFFERENTIAL/PLATELET
Abs Immature Granulocytes: 0.07 10*3/uL (ref 0.00–0.07)
Basophils Absolute: 0 10*3/uL (ref 0.0–0.1)
Basophils Relative: 0 %
Eosinophils Absolute: 0.1 10*3/uL (ref 0.0–0.5)
Eosinophils Relative: 1 %
HCT: 43.5 % (ref 36.0–46.0)
Hemoglobin: 14.1 g/dL (ref 12.0–15.0)
Immature Granulocytes: 1 %
Lymphocytes Relative: 17 %
Lymphs Abs: 1.6 10*3/uL (ref 0.7–4.0)
MCH: 28.1 pg (ref 26.0–34.0)
MCHC: 32.4 g/dL (ref 30.0–36.0)
MCV: 86.7 fL (ref 80.0–100.0)
Monocytes Absolute: 0.9 10*3/uL (ref 0.1–1.0)
Monocytes Relative: 10 %
Neutro Abs: 6.4 10*3/uL (ref 1.7–7.7)
Neutrophils Relative %: 71 %
Platelets: 539 10*3/uL — ABNORMAL HIGH (ref 150–400)
RBC: 5.02 MIL/uL (ref 3.87–5.11)
RDW: 14.9 % (ref 11.5–15.5)
WBC: 9 10*3/uL (ref 4.0–10.5)
nRBC: 0 % (ref 0.0–0.2)

## 2023-03-09 LAB — BASIC METABOLIC PANEL
Anion gap: 16 — ABNORMAL HIGH (ref 5–15)
BUN: 19 mg/dL (ref 6–20)
CO2: 19 mmol/L — ABNORMAL LOW (ref 22–32)
Calcium: 9.9 mg/dL (ref 8.9–10.3)
Chloride: 104 mmol/L (ref 98–111)
Creatinine, Ser: 0.95 mg/dL (ref 0.44–1.00)
GFR, Estimated: >60 mL/min (ref >60)
Glucose, Bld: 79 mg/dL (ref 70–99)
Potassium: 3.9 mmol/L (ref 3.5–5.1)
Sodium: 139 mmol/L (ref 135–145)

## 2023-03-09 LAB — D-DIMER, QUANTITATIVE: D-Dimer, Quant: 2.71 ug/mL-FEU — ABNORMAL HIGH (ref 0.00–0.50)

## 2023-03-09 LAB — SARS CORONAVIRUS 2 BY RT PCR: SARS Coronavirus 2 by RT PCR: NEGATIVE

## 2023-03-09 LAB — TROPONIN I (HIGH SENSITIVITY)
Troponin I (High Sensitivity): <2 ng/L (ref <18)
Troponin I (High Sensitivity): 2 ng/L (ref <18)

## 2023-03-09 LAB — BRAIN NATRIURETIC PEPTIDE: B Natriuretic Peptide: 12.1 pg/mL (ref 0.0–100.0)

## 2023-03-09 MED ORDER — ALBUTEROL SULFATE (2.5 MG/3ML) 0.083% IN NEBU
2.5000 mg | INHALATION_SOLUTION | Freq: Once | RESPIRATORY_TRACT | Status: AC
  Administered 2023-03-09: 2.5 mg via RESPIRATORY_TRACT
  Filled 2023-03-09: qty 3

## 2023-03-09 NOTE — ED Triage Notes (Signed)
Pt came in via POV d/t SOB for past 2 weeks & leg tingling, Pt denies pain & states her head feels funny, almost like she's intoxicated.

## 2023-03-09 NOTE — ED Provider Triage Note (Signed)
Emergency Medicine Provider Triage Evaluation Note  Toni Atkinson , a 52 y.o. female  was evaluated in triage.  Pt complains of shortness of breath for a week, having chest tightness and pain to the right side.  Started having tingling in her lower extremities this morning.  Is not ascending, not to the upper extremities.  Review of Systems  Per HPI  Physical Exam  There were no vitals taken for this visit. Gen:   Awake, no distress   Resp:  Normal effort  MSK:   Moves extremities without difficulty  Other:    Medical Decision Making  Medically screening exam initiated at 2:43 PM.  Appropriate orders placed.  Toni Atkinson was informed that the remainder of the evaluation will be completed by another provider, this initial triage assessment does not replace that evaluation, and the importance of remaining in the ED until their evaluation is complete.     Theron Arista, PA-C 03/09/23 1446

## 2023-03-09 NOTE — ED Provider Notes (Signed)
[facility] DEPARTMENT AT [facility] Provider Note   CSN: [id number] Arrival date & time: 03/09/23  1417     History {Add pertinent medical, surgical, social history, OB history to HPI:1} Chief Complaint  Patient presents with   SOB   Leg Tingling    Toni Atkinson is a 52 y.o. female.  She is here with a complaint of continued shortness of breath cough tightness in her chest that been going on for over a week.  She has not been able to work.  Saw primary care doctor who ordered an x-ray and had a COVID and flu test that were negative.  She went back to work and noted some tingling in her legs when she was standing.  She also feels some brain fog.  She did have recent surgery to implant inspire for some OSA symptoms.  The history is provided by the patient.  Shortness of Breath Severity:  Moderate Onset quality:  Gradual Duration:  2 weeks Timing:  Constant Progression:  Unchanged Chronicity:  New Relieved by:  Nothing Worsened by:  Activity and coughing Ineffective treatments:  Rest Associated symptoms: chest pain and cough   Associated symptoms: no abdominal pain, no fever, no hemoptysis and no sputum production        Home Medications Prior to Admission medications   Medication Sig Start Date End Date Taking? Authorizing Provider  acyclovir (ZOVIRAX) 200 MG capsule Take 200 mg by mouth at bedtime. 10/11/20   [provider]  alendronate (FOSAMAX) 70 MG tablet Take 70 mg by mouth once a week. Take with a full glass of water on an empty stomach.    [provider]  busPIRone (BUSPAR) 10 MG tablet Take 10 mg by mouth 2 (two) times daily. 10/15/20   [provider]  CALCIUM PO Take 500 mg by mouth 2 (two) times daily.    [provider]  cholecalciferol 25 MCG (1000 UT) tablet Take 1,000 Units by mouth 2 (two) times daily.    [provider]  cyclobenzaprine (FLEXERIL) 10 MG tablet Take 10 mg by mouth at bedtime.  05/30/20   [provider]  FLUoxetine (PROZAC) 40 MG capsule Take 40 mg by mouth every morning. 08/29/20   [provider]  fluticasone (FLONASE) 50 MCG/ACT nasal spray Place 1 spray into both nostrils daily. 09/08/22 09/08/23  [provider]  gabapentin (NEURONTIN) 300 MG capsule Take 300-1,200 mg by mouth See admin instructions. Take 300 mg in the morning and the afternoon and 1200 mg at bedtime 03/27/20   [provider]  NURTEC 75 MG TBDP Take 75 mg by mouth daily as needed (migraine).    [provider]  omeprazole (PRILOSEC) 20 MG capsule Take 20 mg by mouth daily.    [provider]  Tofacitinib Citrate ER (XELJANZ XR) 11 MG TB24 TAKE ONE TABLET BY MOUTH ONCE DAILY. MAY BE TAKEN WITH OR WITHOUT FOOD. SWALLOW TABLET WHOLE. DO NOT CRUSH, SPLIT OR CHEW. STORE AT ROOM TEMPERATURE. 02/04/20   [provider]      Allergies    Bactrim [sulfamethoxazole-trimethoprim] and Bupropion    Review of Systems   Review of Systems  Constitutional:  Negative for fever.  Eyes:  Negative for visual disturbance.  Respiratory:  Positive for cough and shortness of breath. Negative for hemoptysis and sputum production.   Cardiovascular:  Positive for chest pain.  Gastrointestinal:  Negative for abdominal pain.  Neurological:  Positive for numbness.  Physical Exam Updated Vital Signs BP 134/89 (BP Location: Left Arm)   Pulse 81   Temp 97.7 F (36.5 C) (Oral)   Resp 15   SpO2 100%  Physical Exam Vitals and nursing note reviewed.  Constitutional:      General: She is not in acute distress.    Appearance: Normal appearance. She is well-developed.  HENT:     Head: Normocephalic and atraumatic.  Eyes:     Conjunctiva/sclera: Conjunctivae normal.  Cardiovascular:     Rate and Rhythm: Normal rate and regular rhythm.     Heart sounds: No murmur heard. Pulmonary:     Effort: Pulmonary effort is normal. No respiratory distress.     Breath  sounds: Normal breath sounds.  Abdominal:     Palpations: Abdomen is soft.     Tenderness: There is no abdominal tenderness. There is no guarding or rebound.  Musculoskeletal:        General: Normal range of motion.     Cervical back: Neck supple.     Right lower leg: No edema.     Left lower leg: No edema.  Skin:    General: Skin is warm and dry.     Capillary Refill: Capillary refill takes less than 2 seconds.  Neurological:     General: No focal deficit present.     Mental Status: She is alert.     ED Results / Procedures / Treatments   Labs (all labs ordered are listed, but only abnormal results are displayed) Labs Reviewed  BASIC METABOLIC PANEL - Abnormal; Notable for the following components:      Result Value   CO2 19 (*)    Anion gap 16 (*)    All other components within normal limits  CBC WITH DIFFERENTIAL/PLATELET - Abnormal; Notable for the following components:   Platelets 539 (*)    All other components within normal limits  BRAIN NATRIURETIC PEPTIDE  D-DIMER, QUANTITATIVE  TROPONIN I (HIGH SENSITIVITY)  TROPONIN I (HIGH SENSITIVITY)    EKG EKG Interpretation  Date/Time:  Wednesday March 09 2023 14:29:45 EDT Ventricular Rate:  95 PR Interval:  148 QRS Duration: 76 QT Interval:  372 QTC Calculation: 467 R Axis:   77 Text Interpretation: Normal sinus rhythm Normal ECG No previous ECGs available Confirmed by Meridee ScoreButler, Jiyaan Steinhauser [phone]([id number]) on 03/09/2023 8:30:07 PM  Radiology DG Chest 2 View  Result Date: 03/09/2023 CLINICAL DATA:  Shortness of breath EXAM: CHEST - 2 VIEW COMPARISON:  03/04/2023 FINDINGS: The heart size and mediastinal contours are within normal limits. Both lungs are clear. The visualized skeletal structures are unremarkable. Neural stimulator over the right chest and neck again noted. IMPRESSION: No active cardiopulmonary disease. Electronically Signed   By: Judie PetitM.  Shick M.D.   On: 03/09/2023 15:43    Procedures Procedures  {Document cardiac  monitor, telemetry assessment procedure when appropriate:1}  Medications Ordered in ED Medications  albuterol (PROVENTIL) (2.5 MG/3ML) 0.083% nebulizer solution 2.5 mg (has no administration in time range)    ED Course/ Medical Decision Making/ A&P   {   Click here for ABCD2, HEART and other calculatorsREFRESH Note before signing :1}                          Medical Decision Making Amount and/or Complexity of Data Reviewed Labs: ordered.  Risk Prescription drug management.   This patient complains of ***; this involves an extensive number of treatment Options and is a complaint that  carries with it a high risk of complications and morbidity. The differential includes ***  I ordered, reviewed and interpreted labs, which included *** I ordered medication *** and reviewed PMP when indicated. I ordered imaging studies which included *** and I independently    visualized and interpreted imaging which showed *** Additional history obtained from *** Previous records obtained and reviewed *** I consulted *** and discussed lab and imaging findings and discussed disposition.  Cardiac monitoring reviewed, *** Social determinants considered, *** Critical Interventions: ***  After the interventions stated above, I reevaluated the patient and found *** Admission and further testing considered, ***   {Document critical care time when appropriate:1} {Document review of labs and clinical decision tools ie heart score, Chads2Vasc2 etc:1}  {Document your independent review of radiology images, and any outside records:1} {Document your discussion with family members, caretakers, and with consultants:1} {Document social determinants of health affecting pt's care:1} {Document your decision making why or why not admission, treatments were needed:1} Final Clinical Impression(s) / ED Diagnoses Final diagnoses:  None    Rx / DC Orders ED Discharge Orders     None

## 2023-03-10 ENCOUNTER — Emergency Department (HOSPITAL_COMMUNITY): Payer: BC Managed Care – PPO

## 2023-03-10 DIAGNOSIS — R0789 Other chest pain: Secondary | ICD-10-CM | POA: Diagnosis not present

## 2023-03-10 DIAGNOSIS — J9811 Atelectasis: Secondary | ICD-10-CM | POA: Diagnosis not present

## 2023-03-10 DIAGNOSIS — R0602 Shortness of breath: Secondary | ICD-10-CM | POA: Diagnosis not present

## 2023-03-10 MED ORDER — IOHEXOL 350 MG/ML SOLN
75.0000 mL | Freq: Once | INTRAVENOUS | Status: AC | PRN
  Administered 2023-03-10: 75 mL via INTRAVENOUS

## 2023-03-10 NOTE — Discharge Instructions (Addendum)
Fortunately there are no signs of blood clots, no signs of heart failure, no signs of heart attack.  Please follow-up with the pulmonology doctor that is listed

## 2023-03-10 NOTE — ED Provider Notes (Signed)
I assumed care in signout to follow-up on imaging and labs.  Overall workup was unremarkable.  Patient is in no acute distress, no hypoxia, no tachycardia.  She does have a cough on exam with mild wheezing.  She moves all extremities without difficulty.  She is well-perfused in her lower extremities and denies any significant numbness in her extremities.  At this point patient is safe for discharge home.  It appears that she has already seen pulmonology before, will have her follow-up with them   Toni Rhine, MD 03/10/23 0116

## 2023-03-29 DIAGNOSIS — Z79899 Other long term (current) drug therapy: Secondary | ICD-10-CM | POA: Diagnosis not present

## 2023-03-29 DIAGNOSIS — F3341 Major depressive disorder, recurrent, in partial remission: Secondary | ICD-10-CM | POA: Diagnosis not present

## 2023-03-29 DIAGNOSIS — Z13220 Encounter for screening for lipoid disorders: Secondary | ICD-10-CM | POA: Diagnosis not present

## 2023-03-29 DIAGNOSIS — Z0000 Encounter for general adult medical examination without abnormal findings: Secondary | ICD-10-CM | POA: Diagnosis not present

## 2023-03-29 DIAGNOSIS — M069 Rheumatoid arthritis, unspecified: Secondary | ICD-10-CM | POA: Diagnosis not present

## 2023-03-29 DIAGNOSIS — Z23 Encounter for immunization: Secondary | ICD-10-CM | POA: Diagnosis not present

## 2023-03-29 DIAGNOSIS — G43009 Migraine without aura, not intractable, without status migrainosus: Secondary | ICD-10-CM | POA: Diagnosis not present

## 2023-04-14 ENCOUNTER — Ambulatory Visit (INDEPENDENT_AMBULATORY_CARE_PROVIDER_SITE_OTHER): Payer: BC Managed Care – PPO | Admitting: Adult Health

## 2023-04-14 ENCOUNTER — Other Ambulatory Visit: Payer: Self-pay | Admitting: Adult Health

## 2023-04-14 ENCOUNTER — Encounter: Payer: Self-pay | Admitting: Adult Health

## 2023-04-14 VITALS — BP 124/80 | HR 90 | Ht 68.0 in | Wt 185.2 lb

## 2023-04-14 DIAGNOSIS — G4733 Obstructive sleep apnea (adult) (pediatric): Secondary | ICD-10-CM

## 2023-04-14 DIAGNOSIS — Z1211 Encounter for screening for malignant neoplasm of colon: Secondary | ICD-10-CM | POA: Diagnosis not present

## 2023-04-14 NOTE — Assessment & Plan Note (Signed)
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  She is status post inspire implantation on February 02, 2023.  Activation on March 08, 2023.  Patient has been doing well with titration each week.  Most recent titration created a little bit of soreness in the right jaw and tongue area.  Have asked her to step back down for comfort and guide her titration for comfort.  She will be set up for a inspire titration in 6 weeks.  She will have a readiness visit in 4 weeks prior to her titration study.  Plan  Patient Instructions  Set up Inspire Titration study in 6 weeks  Continue on Inspire each night.  Healthy sleep regimen  Do not drive if sleepy  Follow up in 4 weeks and  As needed

## 2023-04-14 NOTE — Progress Notes (Signed)
@Patient  ID: Toni Atkinson, female    DOB: [birth year], 52 y.o.   MRN: [id number]  Chief Complaint  Patient presents with   Follow-up    Referring provider: Shon Hale, *  HPI: 52 year old female seen for sleep consult March 08, 2023 to establish for sleep apnea status post inspire implantation (implanted February 02, 2023) and inspire activation March 08, 2023 Medical history significant for rheumatoid arthritis  TEST/EVENTS :  Sleep study done on 12/02/20 that showed moderate OSA with AHI 17/hr, SPo2 low at 85%.  CPAP intolerant -09/08/22 ENT  for evaluation for hypoglossal nerve stimulator.  -10/2022  Underwent drug induced sleep endoscopy -that showed anterior to posterior obstruction, candidate for Inspire.  -02/02/23 Hypoglossal nerve stimulator implantation -03/08/23 INSPIRE activation-Stimulation with sensation level at 1.2 V and functional level at 1.4 V.  Patient will be on a range of 1.4-2.4.  Start delay will be 60 minutes.  Pause time will be 15 minutes ,  duration will be 9 hours.    04/14/2023 Follow up : OSA -INSPIRE  Patient returns for a 6-week follow-up.  Since last visit patient says she is doing well.  As above patient has known history of sleep apnea and was CPAP intolerant.  She underwent inspire device implantation on February 02, 2023.  Last visit she underwent inspire activation.  Range is 1.4 to 2.4 V.  Patient says each week on Tuesdays she is went up 1 level.  She is currently up to level 6.  She does say that the last 2 days since going up to level 6 she has had some soreness in her right upper jaw.  Considers it very mild.  She says that she does feel that she is more rested and not as tired.  Her snoring is much better.  Significant other is notices a decrease in her amount of snoring and restlessness.  Typically getting in about 8 hours of sleep.  Which is a lot more than she used to.  Sleep sent download shows excellent compliance with daily usage around 8 hours.   Patient has minimal pauses.  Says she just does not get up at night now. Stimulation test today in the office is normal.  Waveform appears good.  Tongue movement is normal.  She denies any abnormal tongue movements, dysphagia or speech issues.  Says her surgical sites have healed well.  Current level is at 1.9 V. Patient does have restless leg syndrome and is on gabapentin through her primary care provider.  Says it is under good control.     Allergies  Allergen Reactions   Bactrim [Sulfamethoxazole-Trimethoprim] Other (See Comments)    Mouth blisters   Bupropion Rash    Other Reaction(s): dry mouth    Immunization History  Administered Date(s) Administered   PFIZER(Purple Top)SARS-COV-2 Vaccination 12/12/2020, 01/02/2021   PNEUMOCOCCAL CONJUGATE-20 03/19/2022    Past Medical History:  Diagnosis Date   Anxiety and depression    GERD (gastroesophageal reflux disease)    Migraine    Osteoporosis    RA (rheumatoid arthritis) (HCC)    RLS (restless legs syndrome)    Sleep apnea     Tobacco History: Social History   Tobacco Use  Smoking Status Never  Smokeless Tobacco Never   Counseling given: Not Answered   Outpatient Medications Prior to Visit  Medication Sig Dispense Refill   acyclovir (ZOVIRAX) 200 MG capsule Take 200 mg by mouth at bedtime.     alendronate (FOSAMAX) 70 MG tablet Take 70  mg by mouth once a week. Take with a full glass of water on an empty stomach.     busPIRone (BUSPAR) 10 MG tablet Take 10 mg by mouth 2 (two) times daily.     CALCIUM PO Take 500 mg by mouth 2 (two) times daily.     cholecalciferol 25 MCG (1000 UT) tablet Take 1,000 Units by mouth 2 (two) times daily.     cyclobenzaprine (FLEXERIL) 10 MG tablet Take 10 mg by mouth at bedtime.     FLUoxetine (PROZAC) 40 MG capsule Take 40 mg by mouth every morning.     fluticasone (FLONASE) 50 MCG/ACT nasal spray Place 1 spray into both nostrils daily.     gabapentin (NEURONTIN) 300 MG capsule Take  300-1,200 mg by mouth See admin instructions. Take 300 mg in the morning and the afternoon and 1200 mg at bedtime     NURTEC 75 MG TBDP Take 75 mg by mouth daily as needed (migraine).     omeprazole (PRILOSEC) 20 MG capsule Take 20 mg by mouth daily.     Tofacitinib Citrate ER (XELJANZ XR) 11 MG TB24 TAKE ONE TABLET BY MOUTH ONCE DAILY. MAY BE TAKEN WITH OR WITHOUT FOOD. SWALLOW TABLET WHOLE. DO NOT CRUSH, SPLIT OR CHEW. STORE AT ROOM TEMPERATURE.     No facility-administered medications prior to visit.     Review of Systems:   Constitutional:   No  weight loss, night sweats,  Fevers, chills, fatigue, or  lassitude.  HEENT:   No headaches,  Difficulty swallowing,  Tooth/dental problems, or  Sore throat,                No sneezing, itching, ear ache, nasal congestion, post nasal drip,   CV:  No chest pain,  Orthopnea, PND, swelling in lower extremities, anasarca, dizziness, palpitations, syncope.   GI  No heartburn, indigestion, abdominal pain, nausea, vomiting, diarrhea, change in bowel habits, loss of appetite, bloody stools.   Resp: No shortness of breath with exertion or at rest.  No excess mucus, no productive cough,  No non-productive cough,  No coughing up of blood.  No change in color of mucus.  No wheezing.  No chest wall deformity  Skin: no rash or lesions.  GU: no dysuria, change in color of urine, no urgency or frequency.  No flank pain, no hematuria   MS:  No joint pain or swelling.  No decreased range of motion.  No back pain.    Physical Exam  BP 124/80 (BP Location: Left Arm, Patient Position: Sitting, Cuff Size: Normal)   Pulse 90   Ht 5\' 8"  (1.727 m)   Wt 185 lb 3.2 oz (84 kg)   SpO2 99%   BMI 28.16 kg/m   GEN: A/Ox3; pleasant , NAD, well nourished    HEENT:  [place]/AT,   NOSE-clear, THROAT-clear, no lesions, no postnasal drip or exudate noted.  Tongue movement is normal.  NECK:  Supple w/ fair ROM; no JVD; normal carotid impulses w/o bruits; no thyromegaly or  nodules palpated; no lymphadenopathy.    RESP  Clear  P & A; w/o, wheezes/ rales/ or rhonchi. no accessory muscle use, no dullness to percussion  CARD:  RRR, no m/r/g, no peripheral edema, pulses intact, no cyanosis or clubbing.  GI:   Soft & nt; nml bowel sounds; no organomegaly or masses detected.   Musco: Warm bil, no deformities or joint swelling noted.   Neuro: alert, no focal deficits noted.  Skin: Warm, no lesions or rashes, surgical sites are well-healed without any significant redness    Lab Results:   BNP   ProBNP No results found for: "PROBNP"  Imaging: No results found.        No data to display          No results found for: "NITRICOXIDE"      Assessment & Plan:   Obstructive sleep apnea Moderate obstructive sleep apnea with CPAP intolerance.  She is status post inspire implantation on February 02, 2023.  Activation on March 08, 2023.  Patient has been doing well with titration each week.  Most recent titration created a little bit of soreness in the right jaw and tongue area.  Have asked her to step back down for comfort and guide her titration for comfort.  She will be set up for a inspire titration in 6 weeks.  She will have a readiness visit in 4 weeks prior to her titration study.  Plan  Patient Instructions  Set up Inspire Titration study in 6 weeks  Continue on Inspire each night.  Healthy sleep regimen  Do not drive if sleepy  Follow up in 4 weeks and  As needed       I spent   34 minutes dedicated to the care of this patient on the date of this encounter to include pre-visit review of records, face-to-face time with the patient discussing conditions above, post visit ordering of testing, clinical documentation with the electronic health record, making appropriate referrals as documented, and communicating necessary findings to members of the patients care team.     Rubye Oaks, NP 04/14/2023

## 2023-04-14 NOTE — Progress Notes (Signed)
Reviewed and agree with assessment/plan.   Coralyn Helling, MD [facility] Pulmonary/Critical Care 04/14/2023, 3:18 PM Pager:  [phone]

## 2023-04-14 NOTE — Patient Instructions (Addendum)
Set up Inspire Titration study in 6 weeks  Continue on Inspire each night.  Healthy sleep regimen  Do not drive if sleepy  Follow up in 4 weeks and  As needed

## 2023-04-15 ENCOUNTER — Encounter: Payer: Self-pay | Admitting: Adult Health

## 2023-05-12 ENCOUNTER — Ambulatory Visit: Payer: BC Managed Care – PPO | Admitting: Adult Health

## 2023-05-12 ENCOUNTER — Encounter (HOSPITAL_BASED_OUTPATIENT_CLINIC_OR_DEPARTMENT_OTHER): Payer: BC Managed Care – PPO | Admitting: Pulmonary Disease

## 2023-05-26 ENCOUNTER — Ambulatory Visit (HOSPITAL_BASED_OUTPATIENT_CLINIC_OR_DEPARTMENT_OTHER): Payer: BC Managed Care – PPO | Admitting: Pulmonary Disease

## 2023-06-09 ENCOUNTER — Ambulatory Visit (INDEPENDENT_AMBULATORY_CARE_PROVIDER_SITE_OTHER): Payer: BC Managed Care – PPO | Admitting: Adult Health

## 2023-06-09 ENCOUNTER — Encounter: Payer: Self-pay | Admitting: Adult Health

## 2023-06-09 VITALS — BP 124/78 | HR 81 | Temp 99.0°F | Ht 68.0 in | Wt 178.4 lb

## 2023-06-09 DIAGNOSIS — G4733 Obstructive sleep apnea (adult) (pediatric): Secondary | ICD-10-CM | POA: Diagnosis not present

## 2023-06-09 NOTE — Progress Notes (Signed)
@Patient  ID: Toni Atkinson, female    DOB: 16-Jul-1971, 52 y.o.   MRN: [id number]  Chief Complaint  Patient presents with   Follow-up    Referring provider: Shon Hale, *  HPI: 52 year old female seen for sleep consult March 08, 2023 to establish for sleep apnea status post inspire implantation Inspire implantation in February 02, 2023 and inspire activation March 08, 2023 Medical history significant for rheumatoid arthritis    TEST/EVENTS :  Sleep study done on 12/02/20 that showed moderate OSA with AHI 17/hr, SPo2 low at 85%.  CPAP intolerant -09/08/22 ENT  for evaluation for hypoglossal nerve stimulator.  -10/2022  Underwent drug induced sleep endoscopy -that showed anterior to posterior obstruction, candidate for Inspire.  -02/02/23 Hypoglossal nerve stimulator implantation -03/08/23 INSPIRE activation-Stimulation with sensation level at 1.2 V and functional level at 1.4 V.  Patient will be on a range of 1.4-2.4.  Start delay will be 60 minutes.  Pause time will be 15 minutes ,  duration will be 9 hours.     06/09/2023 Follow up : OSA,INSPIRE  Patient returns for a 77-month follow-up.  Patient is followed for sleep apnea that was CPAP intolerant.  She underwent inspire implantation February 02, 2023.  Inspire activation was March 08, 2023.  Since last visit patient says she has been doing well.  She has been slowly titrating up on her inspire levels.  She says overall it has been comfortable.  She had some jaw discomfort on level 7 went back down to level 6 briefly and then slowly went back up and is currently on level 9 which is at 2.2 V.  Current range is at 1.4 to 2.4 V.  She has been on level 9 for the last 4 days does feel that she has some tightness in her throat initially but that has gotten some better.  Patient does feel that she is benefiting from inspire and sleeping better throughout the night and not as tired during the daytime.  Inspire usage download shows 93% usage.  Daily  average usage at 6 hours.  Patient says she does have some episodes where she wakes up and is hard to go back to sleep.  We discussed increasing her pause time to 30 minutes.  Current setting 2.2 V  Start delay 1 hour, Pulse time 30 minutes Duration 9 hours    Allergies  Allergen Reactions   Bactrim [Sulfamethoxazole-Trimethoprim] Other (See Comments)    Mouth blisters   Bupropion Rash    Other Reaction(s): dry mouth    Immunization History  Administered Date(s) Administered   PFIZER(Purple Top)SARS-COV-2 Vaccination 12/12/2020, 01/02/2021   PNEUMOCOCCAL CONJUGATE-20 03/19/2022    Past Medical History:  Diagnosis Date   Anxiety and depression    GERD (gastroesophageal reflux disease)    Migraine    Osteoporosis    RA (rheumatoid arthritis) (HCC)    RLS (restless legs syndrome)    Sleep apnea     Tobacco History: Social History   Tobacco Use  Smoking Status Never  Smokeless Tobacco Never   Counseling given: Not Answered   Outpatient Medications Prior to Visit  Medication Sig Dispense Refill   acyclovir (ZOVIRAX) 200 MG capsule Take 200 mg by mouth at bedtime.     alendronate (FOSAMAX) 70 MG tablet Take 70 mg by mouth once a week. Take with a full glass of water on an empty stomach.     busPIRone (BUSPAR) 10 MG tablet Take 10 mg by mouth 2 (two) times  daily.     CALCIUM PO Take 500 mg by mouth 2 (two) times daily.     cholecalciferol 25 MCG (1000 UT) tablet Take 1,000 Units by mouth 2 (two) times daily.     cyclobenzaprine (FLEXERIL) 10 MG tablet Take 10 mg by mouth at bedtime.     FLUoxetine (PROZAC) 40 MG capsule Take 40 mg by mouth every morning.     fluticasone (FLONASE) 50 MCG/ACT nasal spray Place 1 spray into both nostrils daily.     gabapentin (NEURONTIN) 300 MG capsule Take 300-1,200 mg by mouth See admin instructions. Take 300 mg in the morning and the afternoon and 1200 mg at bedtime     NURTEC 75 MG TBDP Take 75 mg by mouth daily as needed (migraine).      omeprazole (PRILOSEC) 20 MG capsule Take 20 mg by mouth daily.     Tofacitinib Citrate ER (XELJANZ XR) 11 MG TB24 TAKE ONE TABLET BY MOUTH ONCE DAILY. MAY BE TAKEN WITH OR WITHOUT FOOD. SWALLOW TABLET WHOLE. DO NOT CRUSH, SPLIT OR CHEW. STORE AT ROOM TEMPERATURE.     No facility-administered medications prior to visit.     Review of Systems:   Constitutional:   No  weight loss, night sweats,  Fevers, chills, fatigue, or  lassitude.  HEENT:   No headaches,  Difficulty swallowing,  Tooth/dental problems, or  Sore throat,                No sneezing, itching, ear ache, nasal congestion, post nasal drip,   CV:  No chest pain,  Orthopnea, PND, swelling in lower extremities, anasarca, dizziness, palpitations, syncope.   GI  No heartburn, indigestion, abdominal pain, nausea, vomiting, diarrhea, change in bowel habits, loss of appetite, bloody stools.   Resp: No shortness of breath with exertion or at rest.  No excess mucus, no productive cough,  No non-productive cough,  No coughing up of blood.  No change in color of mucus.  No wheezing.  No chest wall deformity  Skin: no rash or lesions.  GU: no dysuria, change in color of urine, no urgency or frequency.  No flank pain, no hematuria   MS:  No joint pain or swelling.  No decreased range of motion.  No back pain.    Physical Exam  BP 124/78 (BP Location: Left Arm, Patient Position: Sitting, Cuff Size: Normal)   Pulse 81   Temp 99 F (37.2 C) (Oral)   Ht 5\' 8"  (1.727 m)   Wt 178 lb 6.4 oz (80.9 kg)   SpO2 97%   BMI 27.13 kg/m   GEN: A/Ox3; pleasant , NAD, well nourished    HEENT:  [place]/AT,  NOSE-clear, THROAT-clear, no lesions, no postnasal drip or exudate noted.  Tongue movement is normal.  NECK:  Supple w/ fair ROM; no JVD; normal carotid impulses w/o bruits; no thyromegaly or nodules palpated; no lymphadenopathy.    RESP  Clear  P & A; w/o, wheezes/ rales/ or rhonchi. no accessory muscle use, no dullness to  percussion  CARD:  RRR, no m/r/g, no peripheral edema, pulses intact, no cyanosis or clubbing.  GI:   Soft & nt; nml bowel sounds; no organomegaly or masses detected.   Musco: Warm bil, no deformities or joint swelling noted.   Neuro: alert, no focal deficits noted.    Skin: Warm, no lesions or rashes    Lab Results:  CBC    ProBNP No results found for: "PROBNP"  Imaging: No results found.  Administration History     None           No data to display          No results found for: "NITRICOXIDE"      Assessment & Plan:   Obstructive sleep apnea Moderate obstructive sleep apnea with CPAP intolerance.  Status post inspire implantation February 02, 2023, activation March 08, 2023.  Patient has been tolerating titration well.  She has an upcoming inspire titration study on June 22, 2023.  Pause time was increased to 30 minutes.   Plan  Patient Instructions  Inspire titration later this month .  Healthy sleep regimen  Do not drive if sleepy  Follow up in 4 weeks with Dr. Wynona Neat or Nakaila Freeze NP and  As needed        Rubye Oaks, NP 06/09/2023

## 2023-06-09 NOTE — Patient Instructions (Addendum)
Inspire titration later this month .  Healthy sleep regimen  Do not drive if sleepy  Follow up in 4 weeks with Dr. Wynona Neat or Gladiola Madore NP and  As needed

## 2023-06-09 NOTE — Assessment & Plan Note (Signed)
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  Status post inspire implantation February 02, 2023, activation March 08, 2023.  Patient has been tolerating titration well.  She has an upcoming inspire titration study on June 22, 2023.  Pause time was increased to 30 minutes.   Plan  Patient Instructions  Inspire titration later this month .  Healthy sleep regimen  Do not drive if sleepy  Follow up in 4 weeks with Dr. Wynona Neat or Miriah Maruyama NP and  As needed

## 2023-06-22 ENCOUNTER — Ambulatory Visit (HOSPITAL_BASED_OUTPATIENT_CLINIC_OR_DEPARTMENT_OTHER): Payer: BC Managed Care – PPO | Attending: Adult Health | Admitting: Pulmonary Disease

## 2023-06-22 DIAGNOSIS — G4733 Obstructive sleep apnea (adult) (pediatric): Secondary | ICD-10-CM | POA: Insufficient documentation

## 2023-06-22 DIAGNOSIS — G4710 Hypersomnia, unspecified: Secondary | ICD-10-CM | POA: Diagnosis not present

## 2023-06-24 DIAGNOSIS — G4733 Obstructive sleep apnea (adult) (pediatric): Secondary | ICD-10-CM | POA: Diagnosis not present

## 2023-06-24 NOTE — Procedures (Signed)
Patient Name: Toni Atkinson, Toni Atkinson Date: 06/22/2023 Gender: Female D.O.B: [birth year] Age (years): 27 Referring Provider: Tammy Parrett Height (inches): 68 Interpreting Physician: Cyril Mourning MD, ABSM Weight (lbs): 173 RPSGT: [place] BMI: 26 MRN: [id number] Neck Size: 14.50 <br> <br> CLINICAL INFORMATION The patient is referred for a CPAP titration to treat sleep apnea.  12/02/20 HST showed moderate OSA with AHI 17/hr, SPo2 low at 85%  SLEEP STUDY TECHNIQUE As per the AASM Manual for the Scoring of Sleep and Associated Events v2.3 (April 2016) with a hypopnea requiring 4% desaturations.  The channels recorded and monitored were frontal, central and occipital EEG, electrooculogram (EOG), submentalis EMG (chin), nasal and oral airflow, thoracic and abdominal wall motion, anterior tibialis EMG, snore microphone, electrocardiogram, and pulse oximetry. Continuous positive airway pressure (CPAP) was initiated at the beginning of the study and titrated to treat sleep-disordered breathing.  MEDICATIONS Medications self-administered by patient taken the night of the study : N/A  TECHNICIAN COMMENTS Comments added by technician: None Comments added by scorer: N/A RESPIRATORY PARAMETERS Optimal PAP Pressure (cm): 2.2 AHI at Optimal Pressure (/hr): 0.6 Overall Minimal O2 (%): 89.0 Supine % at Optimal Pressure (%): 0 Minimal O2 at Optimal Pressure (%): 89.0   SLEEP ARCHITECTURE The study was initiated at 10:26:40 PM and ended at 4:44:53 AM.  Sleep onset time was 22.1 minutes and the sleep efficiency was 64.6%. The total sleep time was 244.5 minutes.  The patient spent 15.3% of the night in stage N1 sleep, 66.9% in stage N2 sleep, 16.2% in stage N3 and 1.6% in REM.Stage REM latency was 292.5 minutes  Wake after sleep onset was 111.6. Alpha intrusion was absent. Supine sleep was 0.00%.  CARDIAC DATA The 2 lead EKG demonstrated sinus rhythm. The mean heart rate was 72.2 beats per  minute. Other EKG findings include: None.   LEG MOVEMENT DATA The total Periodic Limb Movements of Sleep (PLMS) were 0. The PLMS index was 0.0. A PLMS index of <15 is considered normal in adults.  IMPRESSIONS - The optimal PAP pressure was 2.2 V - Mild oxygen desaturations were observed during this titration (min O2 = 89.0%). - No snoring was audible during this study. - No cardiac abnormalities were observed during this study. - Clinically significant periodic limb movements were not noted during this study. Arousals associated with PLMs were rare.   DIAGNOSIS - Obstructive Sleep Apnea (G47.33)   RECOMMENDATIONS - Maintain setting of 2.2 V - Avoid alcohol, sedatives and other CNS depressants that may worsen sleep apnea and disrupt normal sleep architecture. - Sleep hygiene should be reviewed to assess factors that may improve sleep quality. - Weight management and regular exercise should be initiated or continued. - Return to [facility] for re-evaluation after 4 weeks of therapy  [Electronically signed] 06/24/2023 06:49 PM  Cyril Mourning MD, ABSM Diplomate, American Board of Sleep Medicine NPI: [id number]

## 2023-07-05 ENCOUNTER — Other Ambulatory Visit (HOSPITAL_BASED_OUTPATIENT_CLINIC_OR_DEPARTMENT_OTHER): Payer: Self-pay

## 2023-07-05 ENCOUNTER — Emergency Department (HOSPITAL_BASED_OUTPATIENT_CLINIC_OR_DEPARTMENT_OTHER)
Admission: EM | Admit: 2023-07-05 | Discharge: 2023-07-05 | Disposition: A | Discharge status: Home / Self Care | Payer: BC Managed Care – PPO | Attending: Emergency Medicine | Admitting: Emergency Medicine

## 2023-07-05 ENCOUNTER — Other Ambulatory Visit: Payer: Self-pay

## 2023-07-05 ENCOUNTER — Emergency Department (HOSPITAL_BASED_OUTPATIENT_CLINIC_OR_DEPARTMENT_OTHER): Payer: BC Managed Care – PPO | Admitting: Radiology

## 2023-07-05 ENCOUNTER — Encounter (HOSPITAL_BASED_OUTPATIENT_CLINIC_OR_DEPARTMENT_OTHER): Payer: Self-pay | Admitting: Emergency Medicine

## 2023-07-05 DIAGNOSIS — S80911A Unspecified superficial injury of right knee, initial encounter: Secondary | ICD-10-CM | POA: Diagnosis not present

## 2023-07-05 DIAGNOSIS — W010XXA Fall on same level from slipping, tripping and stumbling without subsequent striking against object, initial encounter: Secondary | ICD-10-CM | POA: Diagnosis not present

## 2023-07-05 DIAGNOSIS — M25462 Effusion, left knee: Secondary | ICD-10-CM | POA: Diagnosis not present

## 2023-07-05 DIAGNOSIS — S82042A Displaced comminuted fracture of left patella, initial encounter for closed fracture: Secondary | ICD-10-CM | POA: Diagnosis not present

## 2023-07-05 DIAGNOSIS — S82002A Unspecified fracture of left patella, initial encounter for closed fracture: Secondary | ICD-10-CM | POA: Diagnosis not present

## 2023-07-05 DIAGNOSIS — W19XXXA Unspecified fall, initial encounter: Secondary | ICD-10-CM

## 2023-07-05 DIAGNOSIS — M25562 Pain in left knee: Secondary | ICD-10-CM | POA: Diagnosis not present

## 2023-07-05 MED ORDER — OXYCODONE-ACETAMINOPHEN 5-325 MG PO TABS
1.0000 | ORAL_TABLET | Freq: Once | ORAL | Status: AC
  Administered 2023-07-05: 1 via ORAL
  Filled 2023-07-05: qty 1

## 2023-07-05 MED ORDER — OXYCODONE-ACETAMINOPHEN 5-325 MG PO TABS: 1.0000 | ORAL_TABLET | Freq: Four times a day (QID) | ORAL | 0 refills | Status: DC | PRN

## 2023-07-05 NOTE — Discharge Instructions (Addendum)
You were seen in the ER following a fall and knee injury. Unfortunately, your xray did show evidence of a comminuted mildly displaced patellar fracture. I spoke with Dr. Odis Hollingshead with [facility] who advised placing you into a knee immobilizer. I have also sent a prescription for Percocet to your pharmacy for more severe pain. Take Tylenol, Aleve, or Advil for more mild pain. Return to the ER if symptoms are worsening, but otherwise follow up with Dr. Duwayne Heck with [facility] for evaluation of your knee fracture.

## 2023-07-05 NOTE — ED Provider Notes (Signed)
[place] EMERGENCY DEPARTMENT AT [facility] Provider Note   CSN: [id number] Arrival date & time: 07/05/23  0932     History Chief Complaint  Patient presents with   Fall   Knee Injury    Toni Atkinson is a 52 y.o. female.  Patient with past history significant for restless leg syndrome, rheumatoid arthritis, major depressive disorder presents to the emergency department complaints of a knee injury.  Reports that she was walking earlier this morning when she tripped on the sidewalk and landed on the anterior lateral portion of her left knee.  States that she is unable to bear weight on the knee since this fall.  No prior history of any injuries to this knee no prior orthopedic surgeries either.  Does not take any medication for pain prior to arriving in the emergency department.  Movement of the knee worsens pain.  Denies any numbness or tingling in the left lower leg.  No syncope, head strike, weakness, confusion.   Fall       Home Medications Prior to Admission medications   Medication Sig Start Date End Date Taking? Authorizing Provider  FLUoxetine (PROZAC) 20 MG capsule Take 60 mg by mouth every morning. 06/01/23  Yes [provider]  oxyCODONE-acetaminophen (PERCOCET/ROXICET) 5-325 MG tablet Take 1 tablet by mouth every 6 (six) hours as needed for severe pain. 07/05/23  Yes Saliyah Gillin A, PA-C  WEGOVY 2.4 MG/0.75ML SOAJ Inject 2.4 mg into the skin once a week. 06/15/23  Yes [provider]  acyclovir (ZOVIRAX) 200 MG capsule Take 200 mg by mouth at bedtime. 10/11/20   [provider]  alendronate (FOSAMAX) 70 MG tablet Take 70 mg by mouth once a week. Take with a full glass of water on an empty stomach.    [provider]  busPIRone (BUSPAR) 10 MG tablet Take 10 mg by mouth 2 (two) times daily. 10/15/20   [provider]  CALCIUM PO Take 500 mg by mouth 2 (two) times daily.    [provider]  cholecalciferol 25 MCG  (1000 UT) tablet Take 1,000 Units by mouth 2 (two) times daily.    [provider]  cyclobenzaprine (FLEXERIL) 10 MG tablet Take 10 mg by mouth at bedtime. 05/30/20   [provider]  FLUoxetine (PROZAC) 40 MG capsule Take 40 mg by mouth every morning. 08/29/20   [provider]  fluticasone (FLONASE) 50 MCG/ACT nasal spray Place 1 spray into both nostrils daily. 09/08/22 09/08/23  [provider]  gabapentin (NEURONTIN) 300 MG capsule Take 300-1,200 mg by mouth See admin instructions. Take 300 mg in the morning and the afternoon and 1200 mg at bedtime 03/27/20   [provider]  NURTEC 75 MG TBDP Take 75 mg by mouth daily as needed (migraine).    [provider]  omeprazole (PRILOSEC) 20 MG capsule Take 20 mg by mouth daily.    [provider]  Tofacitinib Citrate ER (XELJANZ XR) 11 MG TB24 TAKE ONE TABLET BY MOUTH ONCE DAILY. MAY BE TAKEN WITH OR WITHOUT FOOD. SWALLOW TABLET WHOLE. DO NOT CRUSH, SPLIT OR CHEW. STORE AT ROOM TEMPERATURE. 02/04/20   [provider]      Allergies    Bactrim [sulfamethoxazole-trimethoprim] and Bupropion    Review of Systems   Review of Systems  Musculoskeletal:        Knee pain  All other systems reviewed and are negative.   Physical Exam Updated Vital Signs BP 133/77 (BP Location: Right  Arm)   Pulse 82   Temp 98.4 F (36.9 C) (Oral)   Resp 16   Ht 5\' 8"  (1.727 m)   Wt 77.1 kg   SpO2 98%   BMI 25.85 kg/m  Physical Exam Vitals and nursing note reviewed.  Constitutional:      General: She is not in acute distress.    Appearance: She is well-developed.  HENT:     Head: Normocephalic and atraumatic.  Eyes:     Conjunctiva/sclera: Conjunctivae normal.  Cardiovascular:     Rate and Rhythm: Normal rate and regular rhythm.     Heart sounds: No murmur heard. Pulmonary:     Effort: Pulmonary effort is normal. No respiratory distress.     Breath sounds: Normal breath sounds.   Abdominal:     Palpations: Abdomen is soft.     Tenderness: There is no abdominal tenderness.  Musculoskeletal:        General: Swelling, tenderness and signs of injury present. No deformity.     Cervical back: Neck supple.       Legs:     Comments: Mild swelling present over the anterior aspect of the left knee.  No obvious bruising.  Skin:    General: Skin is warm and dry.     Capillary Refill: Capillary refill takes less than 2 seconds.     Comments: For facial abrasion noted to the anterolateral portion of the left knee.  Not actively bleeding.  No apparent depth requiring laceration repair.  Neurological:     Mental Status: She is alert.  Psychiatric:        Mood and Affect: Mood normal.     ED Results / Procedures / Treatments   Labs (all labs ordered are listed, but only abnormal results are displayed) Labs Reviewed - No data to display  EKG None  Radiology DG Knee Complete 4 Views Left  Result Date: 07/05/2023 CLINICAL DATA:  Pain after fall EXAM: LEFT KNEE - COMPLETE 4 VIEW COMPARISON:  None Available. FINDINGS: There is a comminuted fracture of the patella which is mildly displaced. Trace joint fluid on the lateral view. No additional fracture or dislocation. Preserved joint spaces and bone mineralization. IMPRESSION: Comminuted mildly displaced patellar fracture with a small joint effusion. Electronically Signed   By: Karen Kays M.D.   On: 07/05/2023 10:14    Procedures Procedures   Medications Ordered in ED Medications  oxyCODONE-acetaminophen (PERCOCET/ROXICET) 5-325 MG per tablet 1 tablet (1 tablet Oral Given 07/05/23 1004)    ED Course/ Medical Decision Making/ A&P Clinical Course as of 07/05/23 1112  Tue Jul 05, 2023  1034 DG Knee Complete 4 Views Left Comminuted mildly displaced patellar fracture with a small joint effusion. Will consult orthopedics for likely outpatient follow up. [OZ]    Clinical Course User Index [OZ] Smitty Knudsen, PA-C                                Medical Decision Making Amount and/or Complexity of Data Reviewed Radiology: ordered.  Risk Prescription drug management.   This patient presents to the ED for concern of knee injury.  Differential diagnosis includes patellar dislocation, tibial plateau fracture, fall, skin abrasion   Imaging Studies ordered:  I ordered imaging studies including x-ray of left knee I independently visualized and interpreted imaging which showed comminuted mildly displaced patellar fracture I agree with the radiologist interpretation   Medicines ordered and prescription  drug management:  I ordered medication including Percocet for pain Reevaluation of the patient after these medicines showed that the patient improved I have reviewed the patients home medicines and have made adjustments as needed   Problem List / ED Course:  Patient presents to the emergency department complaints of a mechanical fall.  Reports that she was walking on the sidewalk earlier today when she believes she tripped over something and landed on her left knee.  Reports pain and inability to bear weight on this knee due to pain.  No obvious or appreciable deformity on initial assessment.  Mild superficial abrasion noted to the lateral aspect of the anterior left knee.  Not actively bleeding.  Will evaluate with x-ray imaging and pain control given that physical exam is largely unremarkable but range of motion testing difficult due to pain. X-ray of the left knee shows a comminuted mildly displaced patellar fracture.  Will consult orthopedics for likely outpatient management.  Will continue ice, pain control, and place patient into a knee immobilizer. Spoke with Dr. Odis Hollingshead, orthopedic surgeon, who advised knee immobilization, pain control, elevation, and continued icing of knee. Will have patient follow up with Dr. Duwayne Heck with [facility]  within the next week. Informed patient of these recommendations  and patient is agreeable with plan. No open fracture noted and no obvious penetrating injury so doubt need for surgical washout or Tdap administration. Prescription for Percocet sent to patient's pharmacy. Patient agreeable with treatment plan and verbalized understanding all return precautions. All questions answered prior to patient discharge.  Final Clinical Impression(s) / ED Diagnoses Final diagnoses:  Closed displaced comminuted fracture of left patella, initial encounter  Fall, initial encounter    Rx / DC Orders ED Discharge Orders          Ordered    oxyCODONE-acetaminophen (PERCOCET/ROXICET) 5-325 MG tablet  Every 6 hours PRN        07/05/23 1108              Smitty Knudsen, PA-C 07/05/23 1112    Cathren Laine, MD 07/05/23 1437

## 2023-07-05 NOTE — ED Triage Notes (Signed)
Pt arrives to ED with c/o left knee injury after falling off a sidewalk this morning.

## 2023-07-07 DIAGNOSIS — S82042D Displaced comminuted fracture of left patella, subsequent encounter for closed fracture with routine healing: Secondary | ICD-10-CM | POA: Diagnosis not present

## 2023-07-08 DIAGNOSIS — Z124 Encounter for screening for malignant neoplasm of cervix: Secondary | ICD-10-CM | POA: Diagnosis not present

## 2023-07-08 DIAGNOSIS — Z01419 Encounter for gynecological examination (general) (routine) without abnormal findings: Secondary | ICD-10-CM | POA: Diagnosis not present

## 2023-07-08 DIAGNOSIS — N898 Other specified noninflammatory disorders of vagina: Secondary | ICD-10-CM | POA: Diagnosis not present

## 2023-07-14 ENCOUNTER — Encounter: Payer: Self-pay | Admitting: Adult Health

## 2023-07-14 ENCOUNTER — Ambulatory Visit (INDEPENDENT_AMBULATORY_CARE_PROVIDER_SITE_OTHER): Payer: BC Managed Care – PPO | Admitting: Adult Health

## 2023-07-14 VITALS — BP 110/90 | HR 96 | Ht 68.5 in | Wt 170.0 lb

## 2023-07-14 DIAGNOSIS — G4733 Obstructive sleep apnea (adult) (pediatric): Secondary | ICD-10-CM | POA: Diagnosis not present

## 2023-07-14 DIAGNOSIS — S82042D Displaced comminuted fracture of left patella, subsequent encounter for closed fracture with routine healing: Secondary | ICD-10-CM | POA: Diagnosis not present

## 2023-07-14 NOTE — Patient Instructions (Signed)
Continue on INSPIRE each night .  Healthy sleep regimen  Do not drive if sleepy  Follow up in 6 months with Dr. Wynona Neat or Shelli Portilla NP and  As needed  (Virtual or In Person)

## 2023-07-14 NOTE — Assessment & Plan Note (Signed)
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  Patient is status post inspire implantation February 02, 2023.  Activation March 07, 2021 4.  Titration study on June 22, 2023 showed excellent control on 2.2 V.  Patient range has been updated to 2.0-2.4.  Current level at 2.2 V.  Stimulation test was comfortable today.  Waveform was normal.  She will continue at 60 min onset  9 hr duration  30 min pause   Plan  Patient Instructions  Continue on INSPIRE each night .  Healthy sleep regimen  Do not drive if sleepy  Follow up in 6 months with Dr. Wynona Neat or Royale Lennartz NP and  As needed  (Virtual or In Person)

## 2023-07-14 NOTE — Progress Notes (Signed)
Since  @Patient  ID: Toni Atkinson, female    DOB: 1971/04/12, 52 y.o.   MRN: [id number]  Chief Complaint  Patient presents with   Follow-up    Referring provider: Shon Hale, *  HPI: 52 year old female seen for sleep consult March 08, 2023 to establish for sleep apnea status post inspire implantation Inspire implantation on February 02, 2023 and inspire activation on March 08, 2023 Medical history significant for rheumatoid arthritis  TEST/EVENTS :  Sleep study done on 12/02/20 that showed moderate OSA with AHI 17/hr, SPo2 low at 85%.  CPAP intolerant -09/08/22 ENT  for evaluation for hypoglossal nerve stimulator.  -10/2022  Underwent drug induced sleep endoscopy -that showed anterior to posterior obstruction, candidate for Inspire.  -02/02/23 Hypoglossal nerve stimulator implantation -03/08/23 INSPIRE activation-Stimulation with sensation level at 1.2 V and functional level at 1.4 V.  Patient will be on a range of 1.4-2.4.  Start delay will be 60 minutes.  Pause time will be 15 minutes ,  duration will be 9 hours.   07/14/2023 Follow up ; OSA , INSPIRE  Patient returns for a 1 month follow-up.  Patient is followed for sleep apnea.  She was intolerant to CPAP.  She underwent inspire implantation February 02, 2023.  Activation was on March 08, 2023.  Patient has been doing well with inspire.  Says she uses it every single night. Inspire compliance shows 100% usage.  Daily average usage at 7 hours.  Current setting is at 2.7 V.  Patient underwent inspire titration on June 22, 2023 this showed optimal control at 2.2 V.  (AHI 0.6).  We discussed her inspire titration results in detail.  Patient says that most recently the higher levels have not been as comfortable.  She also had a fall with left patella fracture and is going to be requiring surgery next week.  She still feels that she is benefiting from inspire.    Allergies  Allergen Reactions   Bactrim [Sulfamethoxazole-Trimethoprim] Other (See  Comments)    Mouth blisters   Bupropion Rash    Other Reaction(s): dry mouth    Immunization History  Administered Date(s) Administered   PFIZER(Purple Top)SARS-COV-2 Vaccination 12/12/2020, 01/02/2021   PNEUMOCOCCAL CONJUGATE-20 03/19/2022    Past Medical History:  Diagnosis Date   Anxiety and depression    GERD (gastroesophageal reflux disease)    Migraine    Osteoporosis    RA (rheumatoid arthritis) (HCC)    RLS (restless legs syndrome)    Sleep apnea     Tobacco History: Social History   Tobacco Use  Smoking Status Never  Smokeless Tobacco Never   Counseling given: Not Answered   Outpatient Medications Prior to Visit  Medication Sig Dispense Refill   acyclovir (ZOVIRAX) 200 MG capsule Take 200 mg by mouth at bedtime.     alendronate (FOSAMAX) 70 MG tablet Take 70 mg by mouth once a week. Take with a full glass of water on an empty stomach.     busPIRone (BUSPAR) 10 MG tablet Take 10 mg by mouth 2 (two) times daily.     CALCIUM PO Take 500 mg by mouth 2 (two) times daily.     cholecalciferol 25 MCG (1000 UT) tablet Take 1,000 Units by mouth 2 (two) times daily.     cyclobenzaprine (FLEXERIL) 10 MG tablet Take 10 mg by mouth at bedtime.     FLUoxetine (PROZAC) 20 MG capsule Take 60 mg by mouth every morning.     fluticasone (FLONASE) 50 MCG/ACT nasal  spray Place 1 spray into both nostrils daily.     gabapentin (NEURONTIN) 300 MG capsule Take 300-1,200 mg by mouth See admin instructions. Take 300 mg in the morning and the afternoon and 1200 mg at bedtime     NURTEC 75 MG TBDP Take 75 mg by mouth daily as needed (migraine).     omeprazole (PRILOSEC) 20 MG capsule Take 20 mg by mouth daily.     oxyCODONE-acetaminophen (PERCOCET/ROXICET) 5-325 MG tablet Take 1 tablet by mouth every 6 (six) hours as needed for severe pain. 15 tablet 0   Tofacitinib Citrate ER (XELJANZ XR) 11 MG TB24 TAKE ONE TABLET BY MOUTH ONCE DAILY. MAY BE TAKEN WITH OR WITHOUT FOOD. SWALLOW TABLET  WHOLE. DO NOT CRUSH, SPLIT OR CHEW. STORE AT ROOM TEMPERATURE.     WEGOVY 2.4 MG/0.75ML SOAJ Inject 2.4 mg into the skin once a week.     FLUoxetine (PROZAC) 40 MG capsule Take 40 mg by mouth every morning.     No facility-administered medications prior to visit.     Review of Systems:   Constitutional:   No  weight loss, night sweats,  Fevers, chills, fatigue, or  lassitude.  HEENT:   No headaches,  Difficulty swallowing,  Tooth/dental problems, or  Sore throat,                No sneezing, itching, ear ache, nasal congestion, post nasal drip,   CV:  No chest pain,  Orthopnea, PND, swelling in lower extremities, anasarca, dizziness, palpitations, syncope.   GI  No heartburn, indigestion, abdominal pain, nausea, vomiting, diarrhea, change in bowel habits, loss of appetite, bloody stools.   Resp: No shortness of breath with exertion or at rest.  No excess mucus, no productive cough,  No non-productive cough,  No coughing up of blood.  No change in color of mucus.  No wheezing.  No chest wall deformity  Skin: no rash or lesions.  GU: no dysuria, change in color of urine, no urgency or frequency.  No flank pain, no hematuria   MS:  + left knee injury     Physical Exam  BP (!) 110/90 (BP Location: Left Arm, Patient Position: Sitting, Cuff Size: Normal)   Pulse 96   Ht 5' 8.5" (1.74 m)   Wt 170 lb (77.1 kg)   SpO2 99%   BMI 25.47 kg/m   GEN: A/Ox3; pleasant , NAD, well nourished    HEENT:  [place]/AT,  NOSE-clear, THROAT-clear, no lesions, no postnasal drip or exudate noted.  Normal tongue movement   NECK:  Supple w/ fair ROM; no JVD; normal carotid impulses w/o bruits; no thyromegaly or nodules palpated; no lymphadenopathy.    RESP  Clear  P & A; w/o, wheezes/ rales/ or rhonchi. no accessory muscle use, no dullness to percussion  CARD:  RRR, no m/r/g, no peripheral edema, pulses intact, no cyanosis or clubbing.  GI:   Soft & nt; nml bowel sounds; no organomegaly or masses  detected.   Musco: Warm bil, no deformities or joint swelling noted. Left leg brace    Neuro: alert, no focal deficits noted.    Skin: Warm, no lesions or rashes    Lab Results:  CBC   BNP   ProBNP No results found for: "PROBNP"  Imaging:   Administration History     None           No data to display          No results found  for: "NITRICOXIDE"      Assessment & Plan:   Obstructive sleep apnea Moderate obstructive sleep apnea with CPAP intolerance.  Patient is status post inspire implantation February 02, 2023.  Activation March 07, 2021 4.  Titration study on June 22, 2023 showed excellent control on 2.2 V.  Patient range has been updated to 2.0-2.4.  Current level at 2.2 V.  Stimulation test was comfortable today.  Waveform was normal.  She will continue at 60 min onset  9 hr duration  30 min pause   Plan  Patient Instructions  Continue on INSPIRE each night .  Healthy sleep regimen  Do not drive if sleepy  Follow up in 6 months with Dr. Wynona Neat or Nyiah Pianka NP and  As needed  (Virtual or In Person)       Rubye Oaks, NP 07/14/2023

## 2023-07-15 ENCOUNTER — Encounter (HOSPITAL_BASED_OUTPATIENT_CLINIC_OR_DEPARTMENT_OTHER): Payer: Self-pay | Admitting: Orthopedic Surgery

## 2023-07-15 ENCOUNTER — Other Ambulatory Visit: Payer: Self-pay

## 2023-07-20 NOTE — Progress Notes (Signed)
Pt given Ensure and instructed to finish by 0930 morning of surgery. Pt confirmed preop instructions

## 2023-07-22 ENCOUNTER — Encounter (HOSPITAL_BASED_OUTPATIENT_CLINIC_OR_DEPARTMENT_OTHER)
Admission: RE | Disposition: A | Discharge status: Home / Self Care | Payer: Self-pay | Source: Home / Self Care | Attending: Orthopedic Surgery

## 2023-07-22 ENCOUNTER — Ambulatory Visit (HOSPITAL_BASED_OUTPATIENT_CLINIC_OR_DEPARTMENT_OTHER): Payer: BC Managed Care – PPO | Admitting: Certified Registered Nurse Anesthetist

## 2023-07-22 ENCOUNTER — Ambulatory Visit (HOSPITAL_BASED_OUTPATIENT_CLINIC_OR_DEPARTMENT_OTHER)
Admission: RE | Admit: 2023-07-22 | Discharge: 2023-07-22 | Disposition: A | Discharge status: Home / Self Care | Payer: BC Managed Care – PPO | Attending: Orthopedic Surgery | Admitting: Orthopedic Surgery

## 2023-07-22 ENCOUNTER — Ambulatory Visit (HOSPITAL_COMMUNITY): Payer: BC Managed Care – PPO

## 2023-07-22 ENCOUNTER — Encounter (HOSPITAL_BASED_OUTPATIENT_CLINIC_OR_DEPARTMENT_OTHER): Payer: Self-pay | Admitting: Orthopedic Surgery

## 2023-07-22 ENCOUNTER — Other Ambulatory Visit: Payer: Self-pay

## 2023-07-22 DIAGNOSIS — W19XXXA Unspecified fall, initial encounter: Secondary | ICD-10-CM | POA: Diagnosis not present

## 2023-07-22 DIAGNOSIS — S82045A Nondisplaced comminuted fracture of left patella, initial encounter for closed fracture: Secondary | ICD-10-CM | POA: Diagnosis not present

## 2023-07-22 DIAGNOSIS — K219 Gastro-esophageal reflux disease without esophagitis: Secondary | ICD-10-CM | POA: Diagnosis not present

## 2023-07-22 DIAGNOSIS — M069 Rheumatoid arthritis, unspecified: Secondary | ICD-10-CM | POA: Insufficient documentation

## 2023-07-22 DIAGNOSIS — Z01818 Encounter for other preprocedural examination: Secondary | ICD-10-CM

## 2023-07-22 DIAGNOSIS — S82042A Displaced comminuted fracture of left patella, initial encounter for closed fracture: Secondary | ICD-10-CM | POA: Insufficient documentation

## 2023-07-22 DIAGNOSIS — G4730 Sleep apnea, unspecified: Secondary | ICD-10-CM | POA: Diagnosis not present

## 2023-07-22 DIAGNOSIS — G8918 Other acute postprocedural pain: Secondary | ICD-10-CM | POA: Diagnosis not present

## 2023-07-22 HISTORY — DX: Unspecified fracture of unspecified patella, initial encounter for closed fracture: S82.009A

## 2023-07-22 HISTORY — PX: ORIF PATELLA: SHX5033

## 2023-07-22 LAB — POCT PREGNANCY, URINE: Preg Test, Ur: NEGATIVE

## 2023-07-22 SURGERY — OPEN REDUCTION INTERNAL FIXATION (ORIF) PATELLA
Anesthesia: General | Site: Knee | Laterality: Left

## 2023-07-22 MED ORDER — DEXAMETHASONE SODIUM PHOSPHATE 10 MG/ML IJ SOLN
INTRAMUSCULAR | Status: DC | PRN
  Administered 2023-07-22: 4 mg via INTRAVENOUS

## 2023-07-22 MED ORDER — FENTANYL CITRATE (PF) 100 MCG/2ML IJ SOLN
100.0000 ug | Freq: Once | INTRAMUSCULAR | Status: AC
  Administered 2023-07-22: 50 ug via INTRAVENOUS

## 2023-07-22 MED ORDER — KETOROLAC TROMETHAMINE 30 MG/ML IJ SOLN
INTRAMUSCULAR | Status: AC
  Filled 2023-07-22: qty 2

## 2023-07-22 MED ORDER — MEPERIDINE HCL 25 MG/ML IJ SOLN: 6.2500 mg | INTRAMUSCULAR | Status: DC | PRN

## 2023-07-22 MED ORDER — MIDAZOLAM HCL 2 MG/2ML IJ SOLN: 0.5000 mg | Freq: Once | INTRAMUSCULAR | Status: DC | PRN

## 2023-07-22 MED ORDER — ACETAMINOPHEN 500 MG PO TABS
ORAL_TABLET | ORAL | Status: AC
  Filled 2023-07-22: qty 2

## 2023-07-22 MED ORDER — DEXAMETHASONE SODIUM PHOSPHATE 10 MG/ML IJ SOLN
INTRAMUSCULAR | Status: AC
  Filled 2023-07-22: qty 1

## 2023-07-22 MED ORDER — 0.9 % SODIUM CHLORIDE (POUR BTL) OPTIME
TOPICAL | Status: DC | PRN
  Administered 2023-07-22: 300 mL

## 2023-07-22 MED ORDER — FENTANYL CITRATE (PF) 250 MCG/5ML IJ SOLN
INTRAMUSCULAR | Status: DC | PRN
  Administered 2023-07-22: 25 ug via INTRAVENOUS
  Administered 2023-07-22: 50 ug via INTRAVENOUS
  Administered 2023-07-22: 25 ug via INTRAVENOUS

## 2023-07-22 MED ORDER — FENTANYL CITRATE (PF) 100 MCG/2ML IJ SOLN
INTRAMUSCULAR | Status: AC
  Filled 2023-07-22: qty 2

## 2023-07-22 MED ORDER — HYDROMORPHONE HCL 1 MG/ML IJ SOLN
INTRAMUSCULAR | Status: AC
  Filled 2023-07-22: qty 0.5

## 2023-07-22 MED ORDER — ONDANSETRON HCL 4 MG/2ML IJ SOLN
INTRAMUSCULAR | Status: AC
  Filled 2023-07-22: qty 2

## 2023-07-22 MED ORDER — LIDOCAINE 2% (20 MG/ML) 5 ML SYRINGE
INTRAMUSCULAR | Status: DC | PRN
  Administered 2023-07-22: 20 mg via INTRAVENOUS

## 2023-07-22 MED ORDER — MIDAZOLAM HCL 2 MG/2ML IJ SOLN
2.0000 mg | Freq: Once | INTRAMUSCULAR | Status: AC
  Administered 2023-07-22: 1 mg via INTRAVENOUS

## 2023-07-22 MED ORDER — ROPIVACAINE HCL 7.5 MG/ML IJ SOLN
INTRAMUSCULAR | Status: DC | PRN
  Administered 2023-07-22: 20 mL via PERINEURAL

## 2023-07-22 MED ORDER — SCOPOLAMINE 1 MG/3DAYS TD PT72
MEDICATED_PATCH | TRANSDERMAL | Status: AC
  Filled 2023-07-22: qty 1

## 2023-07-22 MED ORDER — LACTATED RINGERS IV SOLN: INTRAVENOUS | Status: DC

## 2023-07-22 MED ORDER — VANCOMYCIN HCL 500 MG IV SOLR
INTRAVENOUS | Status: AC
  Filled 2023-07-22: qty 10

## 2023-07-22 MED ORDER — HYDROMORPHONE HCL 1 MG/ML IJ SOLN
INTRAMUSCULAR | Status: DC | PRN
  Administered 2023-07-22: .5 mg via INTRAVENOUS

## 2023-07-22 MED ORDER — TRANEXAMIC ACID-NACL 1000-0.7 MG/100ML-% IV SOLN
INTRAVENOUS | Status: AC
  Filled 2023-07-22: qty 100

## 2023-07-22 MED ORDER — OXYCODONE HCL 5 MG PO TABS: 5.0000 mg | ORAL_TABLET | Freq: Once | ORAL | Status: DC | PRN

## 2023-07-22 MED ORDER — TRANEXAMIC ACID-NACL 1000-0.7 MG/100ML-% IV SOLN
1000.0000 mg | INTRAVENOUS | Status: AC
  Administered 2023-07-22: 1000 mg via INTRAVENOUS

## 2023-07-22 MED ORDER — PROMETHAZINE HCL 25 MG/ML IJ SOLN: 6.2500 mg | INTRAMUSCULAR | Status: DC | PRN

## 2023-07-22 MED ORDER — PROPOFOL 10 MG/ML IV BOLUS
INTRAVENOUS | Status: DC | PRN
  Administered 2023-07-22: 50 mg via INTRAVENOUS
  Administered 2023-07-22: 150 mg via INTRAVENOUS

## 2023-07-22 MED ORDER — ONDANSETRON HCL 4 MG PO TABS: 4.0000 mg | ORAL_TABLET | Freq: Three times a day (TID) | ORAL | 0 refills | Status: DC | PRN

## 2023-07-22 MED ORDER — MIDAZOLAM HCL 2 MG/2ML IJ SOLN
INTRAMUSCULAR | Status: AC
  Filled 2023-07-22: qty 2

## 2023-07-22 MED ORDER — KETOROLAC TROMETHAMINE 30 MG/ML IJ SOLN
INTRAMUSCULAR | Status: DC | PRN
  Administered 2023-07-22: 30 mg via INTRAVENOUS

## 2023-07-22 MED ORDER — ONDANSETRON HCL 4 MG/2ML IJ SOLN
INTRAMUSCULAR | Status: DC | PRN
  Administered 2023-07-22: 4 mg via INTRAVENOUS

## 2023-07-22 MED ORDER — PHENYLEPHRINE 80 MCG/ML (10ML) SYRINGE FOR IV PUSH (FOR BLOOD PRESSURE SUPPORT)
PREFILLED_SYRINGE | INTRAVENOUS | Status: DC | PRN
  Administered 2023-07-22 (×2): 80 ug via INTRAVENOUS

## 2023-07-22 MED ORDER — LIDOCAINE 2% (20 MG/ML) 5 ML SYRINGE
INTRAMUSCULAR | Status: AC
  Filled 2023-07-22: qty 5

## 2023-07-22 MED ORDER — OXYCODONE HCL 5 MG PO TABS: 5.0000 mg | ORAL_TABLET | ORAL | 0 refills | Status: DC | PRN

## 2023-07-22 MED ORDER — ACETAMINOPHEN 500 MG PO TABS
1000.0000 mg | ORAL_TABLET | Freq: Once | ORAL | Status: AC
  Administered 2023-07-22: 1000 mg via ORAL

## 2023-07-22 MED ORDER — PROPOFOL 10 MG/ML IV BOLUS
INTRAVENOUS | Status: AC
  Filled 2023-07-22: qty 20

## 2023-07-22 MED ORDER — CEFAZOLIN SODIUM-DEXTROSE 2-4 GM/100ML-% IV SOLN
2.0000 g | INTRAVENOUS | Status: AC
  Administered 2023-07-22: 2 g via INTRAVENOUS

## 2023-07-22 MED ORDER — VANCOMYCIN HCL 500 MG IV SOLR
INTRAVENOUS | Status: DC | PRN
Start: 2023-07-22 — End: 2023-07-22
  Administered 2023-07-22: 500 mg via TOPICAL

## 2023-07-22 MED ORDER — HYDROMORPHONE HCL 1 MG/ML IJ SOLN: 0.2500 mg | INTRAMUSCULAR | Status: DC | PRN

## 2023-07-22 MED ORDER — CEFAZOLIN SODIUM-DEXTROSE 2-4 GM/100ML-% IV SOLN
INTRAVENOUS | Status: AC
  Filled 2023-07-22: qty 100

## 2023-07-22 MED ORDER — SCOPOLAMINE 1 MG/3DAYS TD PT72
1.0000 | MEDICATED_PATCH | TRANSDERMAL | Status: DC
  Administered 2023-07-22: 1.5 mg via TRANSDERMAL

## 2023-07-22 MED ORDER — OXYCODONE HCL 5 MG/5ML PO SOLN: 5.0000 mg | Freq: Once | ORAL | Status: DC | PRN

## 2023-07-22 SURGICAL SUPPLY — 81 items
BANDAGE ESMARK 6X9 LF (GAUZE/BANDAGES/DRESSINGS) ×1 IMPLANT
BIT DRILL CANN F/COMP 2.2 (BIT) IMPLANT
BLADE SURG 15 STRL LF DISP TIS (BLADE) ×2 IMPLANT
BLADE SURG 15 STRL SS (BLADE) ×3
BNDG CMPR 5X4 KNIT ELC UNQ LF (GAUZE/BANDAGES/DRESSINGS) ×1
BNDG CMPR 6 X 5 YARDS HK CLSR (GAUZE/BANDAGES/DRESSINGS) ×1
BNDG CMPR 9X6 STRL LF SNTH (GAUZE/BANDAGES/DRESSINGS) ×1
BNDG ELASTIC 4INX 5YD STR LF (GAUZE/BANDAGES/DRESSINGS) ×1 IMPLANT
BNDG ELASTIC 6INX 5YD STR LF (GAUZE/BANDAGES/DRESSINGS) ×1 IMPLANT
BNDG ESMARK 6X9 LF (GAUZE/BANDAGES/DRESSINGS) ×1
BNDG GAUZE DERMACEA FLUFF 4 (GAUZE/BANDAGES/DRESSINGS) IMPLANT
BNDG GZE DERMACEA 4 6PLY (GAUZE/BANDAGES/DRESSINGS)
CANISTER SUCT 1200ML W/VALVE (MISCELLANEOUS) ×1 IMPLANT
CLSR STERI-STRIP ANTIMIC 1/2X4 (GAUZE/BANDAGES/DRESSINGS) IMPLANT
CUFF TOURN SGL QUICK 34 (TOURNIQUET CUFF) ×1
CUFF TRNQT CYL 34X4.125X (TOURNIQUET CUFF) IMPLANT
DRAPE C-ARM 42X72 X-RAY (DRAPES) ×1 IMPLANT
DRAPE C-ARMOR (DRAPES) ×1 IMPLANT
DRAPE EXTREMITY T 121X128X90 (DISPOSABLE) ×1 IMPLANT
DRAPE INCISE IOBAN 66X45 STRL (DRAPES) IMPLANT
DRAPE OEC MINIVIEW 54X84 (DRAPES) IMPLANT
DRAPE U-SHAPE 47X51 STRL (DRAPES) ×1 IMPLANT
DRSG AQUACEL AG ADV 3.5X10 (GAUZE/BANDAGES/DRESSINGS) IMPLANT
DURAPREP 26ML APPLICATOR (WOUND CARE) ×1 IMPLANT
ELECT REM PT RETURN 9FT ADLT (ELECTROSURGICAL) ×1
ELECTRODE REM PT RTRN 9FT ADLT (ELECTROSURGICAL) ×1 IMPLANT
GAUZE SPONGE 4X4 12PLY STRL (GAUZE/BANDAGES/DRESSINGS) ×1 IMPLANT
GLOVE BIO SURGEON STRL SZ7.5 (GLOVE) ×2 IMPLANT
GLOVE BIOGEL PI IND STRL 8 (GLOVE) ×2 IMPLANT
GOWN STRL REUS W/ TWL LRG LVL3 (GOWN DISPOSABLE) ×1 IMPLANT
GOWN STRL REUS W/ TWL XL LVL3 (GOWN DISPOSABLE) ×2 IMPLANT
GOWN STRL REUS W/TWL LRG LVL3 (GOWN DISPOSABLE) ×1
GOWN STRL REUS W/TWL XL LVL3 (GOWN DISPOSABLE) ×3
IMMOBILIZER KNEE 22 UNIV (SOFTGOODS) IMPLANT
IMMOBILIZER KNEE 24 THIGH 36 (MISCELLANEOUS) IMPLANT
IMMOBILIZER KNEE 24 UNIV (MISCELLANEOUS)
K-WIRE BB-TAK (WIRE) ×2
KWIRE BB-TAK (WIRE) IMPLANT
NDL KEITH (NEEDLE) IMPLANT
NEEDLE KEITH (NEEDLE)
NS IRRIG 1000ML POUR BTL (IV SOLUTION) ×1 IMPLANT
PACK ARTHROSCOPY DSU (CUSTOM PROCEDURE TRAY) ×1 IMPLANT
PACK BASIN DAY SURGERY FS (CUSTOM PROCEDURE TRAY) ×1 IMPLANT
PAD CAST 4YDX4 CTTN HI CHSV (CAST SUPPLIES) ×1 IMPLANT
PADDING CAST ABS COTTON 4X4 ST (CAST SUPPLIES) ×1 IMPLANT
PADDING CAST COTTON 4X4 STRL (CAST SUPPLIES) ×1
PASSER SUT SWANSON 36MM LOOP (INSTRUMENTS) IMPLANT
PENCIL SMOKE EVACUATOR (MISCELLANEOUS) ×1 IMPLANT
PLATE PATELLA SUT STAR SMALL (Plate) IMPLANT
SCREW KREULOCK 3.0X10 (Screw) IMPLANT
SCREW LOCK COMP 3X16 (Screw) IMPLANT
SCREW VAL KREULOCK 3.0X12 TI (Screw) IMPLANT
SCREW VAL KREULOCK 3.0X14 TI (Screw) IMPLANT
SLEEVE SCD COMPRESS KNEE MED (STOCKING) ×1 IMPLANT
SPIKE FLUID TRANSFER (MISCELLANEOUS) IMPLANT
SPONGE T-LAP 18X18 ~~LOC~~+RFID (SPONGE) ×1 IMPLANT
STAPLER VISISTAT 35W (STAPLE) IMPLANT
STRIP CLOSURE SKIN 1/2X4 (GAUZE/BANDAGES/DRESSINGS) IMPLANT
SUCTION TUBE FRAZIER 10FR DISP (SUCTIONS) ×1 IMPLANT
SUT ETHILON 3 0 PS 1 (SUTURE) IMPLANT
SUT ETHILON 4 0 PS 2 18 (SUTURE) IMPLANT
SUT FIBERWIRE #2 38 T-5 BLUE (SUTURE)
SUT FIBERWIRE #5 38 CONV NDL (SUTURE)
SUT MNCRL AB 3-0 PS2 18 (SUTURE) ×1 IMPLANT
SUT MNCRL AB 4-0 PS2 18 (SUTURE) IMPLANT
SUT VIC AB 0 CT1 27 (SUTURE) ×1
SUT VIC AB 0 CT1 27XBRD ANBCTR (SUTURE) ×1 IMPLANT
SUT VIC AB 2-0 CT1 27 (SUTURE) ×1
SUT VIC AB 2-0 CT1 TAPERPNT 27 (SUTURE) ×1 IMPLANT
SUT VIC AB 2-0 SH 18 (SUTURE) IMPLANT
SUT VIC AB 2-0 SH 27 (SUTURE)
SUT VIC AB 2-0 SH 27XBRD (SUTURE) IMPLANT
SUT VIC AB 3-0 SH 27 (SUTURE)
SUT VIC AB 3-0 SH 27X BRD (SUTURE) IMPLANT
SUT VIC AB 4-0 PS2 18 (SUTURE) IMPLANT
SUT VICRYL 3-0 CR8 SH (SUTURE) IMPLANT
SUTURE FIBERWR #2 38 T-5 BLUE (SUTURE) IMPLANT
SUTURE FIBERWR #5 38 CONV NDL (SUTURE) IMPLANT
SYR BULB EAR ULCER 3OZ GRN STR (SYRINGE) ×1 IMPLANT
UNDERPAD 30X36 HEAVY ABSORB (UNDERPADS AND DIAPERS) ×1 IMPLANT
YANKAUER SUCT BULB TIP NO VENT (SUCTIONS) ×1 IMPLANT

## 2023-07-22 NOTE — Anesthesia Procedure Notes (Signed)
Anesthesia Regional Block: Adductor canal block   Pre-Anesthetic Checklist: , timeout performed,  Correct Patient, Correct Site, Correct Laterality,  Correct Procedure, Correct Position, site marked,  Risks and benefits discussed,  Surgical consent,  Pre-op evaluation,  At surgeon's request and post-op pain management  Laterality: Left  Prep: chloraprep       Needles:  Injection technique: Single-shot  Needle Type: Echogenic Needle     Needle Length: 9cm  Needle Gauge: 21     Additional Needles:   Procedures:,,,, ultrasound used (permanent image in chart),,    Narrative:  Start time: 07/22/2023 10:18 AM End time: 07/22/2023 10:24 AM Injection made incrementally with aspirations every 5 mL.  Performed by: Personally  Anesthesiologist: Jairo Ben, MD  Additional Notes: Pt identified in Holding room.  Monitors applied. Working IV access confirmed. Timeout, Sterile prep L thigh.  #21ga ECHOgenic Arrow block needle into adductor canal with US guidance.  20cc 0.75% Ropivacaine injected incrementally after negative test dose.  Patient asymptomatic, VSS, no heme aspirated, tolerated well.   Sandford Craze, MD

## 2023-07-22 NOTE — Discharge Instructions (Addendum)
Orthopedic postop instructions:  -Maintain your Bledsoe knee brace and locked position fully extended for the next 2 weeks.  He will begin gradually bending the knee with outpatient therapy.  He may weight-bear as tolerated in the knee brace when it is locked, but should use assistive devices until cleared by therapy.  -Maintain postoperative bandage until your follow-up appointment.  This is waterproof and you may begin showering on postoperative day #3.  Do not submerge underwater.  -Elevate the leg with the "toes above nose."  Do this as often as possible and also apply ice to the left knee as often as possible throughout the day for 30 minutes at a time.  -For the prevention of blood clots take an 81 mg aspirin once per day x 6 weeks.  -For mild to moderate pain use Tylenol and Advil in alternating fashion.  For any breakthrough pain use oxycodone as necessary.  May have Tylenol at 3:38pm if needed.   Post Anesthesia Home Care Instructions  Activity: Get plenty of rest for the remainder of the day. A responsible individual must stay with you for 24 hours following the procedure.  For the next 24 hours, DO NOT: -Drive a car -Advertising copywriter -Drink alcoholic beverages -Take any medication unless instructed by your physician -Make any legal decisions or sign important papers.  Meals: Start with liquid foods such as gelatin or soup. Progress to regular foods as tolerated. Avoid greasy, spicy, heavy foods. If nausea and/or vomiting occur, drink only clear liquids until the nausea and/or vomiting subsides. Call your physician if vomiting continues.  Special Instructions/Symptoms: Your throat may feel dry or sore from the anesthesia or the breathing tube placed in your throat during surgery. If this causes discomfort, gargle with warm salt water. The discomfort should disappear within 24 hours.  If you had a scopolamine patch placed behind your ear for the management of post- operative  nausea and/or vomiting:  1. The medication in the patch is effective for 72 hours, after which it should be removed.  Wrap patch in a tissue and discard in the trash. Wash hands thoroughly with soap and water. 2. You may remove the patch earlier than 72 hours if you experience unpleasant side effects which may include dry mouth, dizziness or visual disturbances. 3. Avoid touching the patch. Wash your hands with soap and water after contact with the patch.

## 2023-07-22 NOTE — Brief Op Note (Signed)
07/22/2023  12:32 PM  PATIENT:  Toni Atkinson  53 y.o. female  PRE-OPERATIVE DIAGNOSIS:  Comminuted fracture of patella left  POST-OPERATIVE DIAGNOSIS:  Comminuted fracture of patella left  PROCEDURE:  Procedure(s): OPEN REDUCTION INTERNAL FIXATION (ORIF) PATELLA (Left)  SURGEON:  Surgeons and Role:    * Yolonda Kida, MD - Primary  PHYSICIAN ASSISTANT: Dion Saucier, PA-C  ANESTHESIA:   regional and general  EBL:  20 cc  BLOOD ADMINISTERED:none  DRAINS: none   LOCAL MEDICATIONS USED:  NONE  SPECIMEN:  No Specimen  DISPOSITION OF SPECIMEN:  N/A  COUNTS:  YES  TOURNIQUET:  * Missing tourniquet times found for documented tourniquets in log: [id number] *  DICTATION: .Note written in EPIC  PLAN OF CARE: Discharge to home after PACU  PATIENT DISPOSITION:  PACU - hemodynamically stable.   Delay start of Pharmacological VTE agent (>24hrs) due to surgical blood loss or risk of bleeding: not applicable

## 2023-07-22 NOTE — Transfer of Care (Signed)
Immediate Anesthesia Transfer of Care Note  Patient: Toni Atkinson  Procedure(s) Performed: OPEN REDUCTION INTERNAL FIXATION (ORIF) PATELLA (Left: Knee)  Patient Location: PACU  Anesthesia Type:General  Level of Consciousness: drowsy  Airway & Oxygen Therapy: Patient Spontanous Breathing and Patient connected to face mask oxygen  Post-op Assessment: Report given to RN and Post -op Vital signs reviewed and stable  Post vital signs: Reviewed and stable  Last Vitals:  Vitals Value Taken Time  BP 106/78   Temp    Pulse 75    Resp 14   SpO2 97     Last Pain:  Vitals:   07/22/23 0936  TempSrc: Oral  PainSc: 2       Patients Stated Pain Goal: 5 (07/22/23 0936)  Complications: No notable events documented.

## 2023-07-22 NOTE — H&P (Signed)
ORTHOPAEDIC H and P  REQUESTING PHYSICIAN: Yolonda Kida, MD  PCP:  Shon Hale, MD  Chief Complaint: Left patella fracture  HPI: Toni Atkinson is a 52 y.o. female who complains of left knee pain and inability to extend the knee following a fall onto a flexed left knee.  She was noted to have a comminuted patella fracture.  In the office she continued with pain and inability to extend and we have recommended open reduction internal fixation to restore extensor mechanism.  No new complaints.  Past Medical History:  Diagnosis Date   Anxiety and depression    GERD (gastroesophageal reflux disease)    Migraine    Osteoporosis    Patella fracture    left   RA (rheumatoid arthritis) (HCC)    RLS (restless legs syndrome)    Sleep apnea    Inspire   Past Surgical History:  Procedure Laterality Date   BREAST ENHANCEMENT SURGERY     2001   DRUG INDUCED ENDOSCOPY N/A 11/09/2022   Procedure: DRUG INDUCED SLEEP ENDOSCOPY;  Surgeon: Osborn Coho, MD;  Location: [facility];  Service: ENT;  Laterality: N/A;   IMPLANTATION OF HYPOGLOSSAL NERVE STIMULATOR Right 02/02/2023   Procedure: IMPLANTATION OF HYPOGLOSSAL NERVE STIMULATOR;  Surgeon: Christia Reading, MD;  Location: [facility] OR;  Service: ENT;  Laterality: Right;   laproscopic surgery mass on ovary  1991   Social History   Socioeconomic History   Marital status: Single    Spouse name: Not on file   Number of children: Not on file   Years of education: Not on file   Highest education level: Not on file  Occupational History   Not on file  Tobacco Use   Smoking status: Never   Smokeless tobacco: Never  Substance and Sexual Activity   Alcohol use: Yes    Comment: socially   Drug use: Not Currently   Sexual activity: Yes    Birth control/protection: I.U.D.    Comment: no periods  Other Topics Concern   Not on file  Social History Narrative   Not on file   Social Determinants of Health    Financial Resource Strain: Not on file  Food Insecurity: Not on file  Transportation Needs: Not on file  Physical Activity: Not on file  Stress: Not on file  Social Connections: Not on file   Family History  Problem Relation Age of Onset   Breast cancer Maternal Aunt    Allergies  Allergen Reactions   Bactrim [Sulfamethoxazole-Trimethoprim] Other (See Comments)    Mouth blisters   Bupropion Rash    Other Reaction(s): dry mouth   Prior to Admission medications   Medication Sig Start Date End Date Taking? Authorizing Provider  acyclovir (ZOVIRAX) 200 MG capsule Take 200 mg by mouth at bedtime. 10/11/20  Yes [provider]  alendronate (FOSAMAX) 70 MG tablet Take 70 mg by mouth once a week. Take with a full glass of water on an empty stomach.   Yes [provider]  busPIRone (BUSPAR) 10 MG tablet Take 10 mg by mouth 2 (two) times daily. 10/15/20  Yes [provider]  CALCIUM PO Take 500 mg by mouth 2 (two) times daily.   Yes [provider]  cholecalciferol 25 MCG (1000 UT) tablet Take 1,000 Units by mouth 2 (two) times daily.   Yes [provider]  cyclobenzaprine (FLEXERIL) 10 MG tablet Take 10 mg by mouth at bedtime. 05/30/20  Yes [provider]  FLUoxetine (PROZAC) 20 MG capsule Take 60 mg by mouth every morning. 06/01/23  Yes [provider]  fluticasone (FLONASE) 50 MCG/ACT nasal spray Place 1 spray into both nostrils daily. 09/08/22 09/08/23 Yes [provider]  gabapentin (NEURONTIN) 300 MG capsule Take 300-1,200 mg by mouth See admin instructions. Take 300 mg in the morning and the afternoon and 1200 mg at bedtime 03/27/20  Yes [provider]  NURTEC 75 MG TBDP Take 75 mg by mouth daily as needed (migraine).   Yes [provider]  omeprazole (PRILOSEC) 20 MG capsule Take 20 mg by mouth daily.   Yes [provider]  oxyCODONE-acetaminophen (PERCOCET/ROXICET) 5-325 MG tablet Take 1  tablet by mouth every 6 (six) hours as needed for severe pain. 07/05/23  Yes Smitty Knudsen, PA-C  Tofacitinib Citrate ER (XELJANZ XR) 11 MG TB24 TAKE ONE TABLET BY MOUTH ONCE DAILY. MAY BE TAKEN WITH OR WITHOUT FOOD. SWALLOW TABLET WHOLE. DO NOT CRUSH, SPLIT OR CHEW. STORE AT ROOM TEMPERATURE. 02/04/20  Yes [provider]  WEGOVY 2.4 MG/0.75ML SOAJ Inject 2.4 mg into the skin once a week. 06/15/23  Yes [provider]   No results found.  Positive ROS: All other systems have been reviewed and were otherwise negative with the exception of those mentioned in the HPI and as above.  Physical Exam: General: Alert, no acute distress Cardiovascular: No pedal edema Respiratory: No cyanosis, no use of accessory musculature GI: No organomegaly, abdomen is soft and non-tender Skin: No lesions in the area of chief complaint Neurologic: Sensation intact distally Psychiatric: Patient is competent for consent with normal mood and affect Lymphatic: No axillary or cervical lymphadenopathy  MUSCULOSKELETAL: Left lower extremity is warm and well-perfused with no open wounds or lesions.  Bruising and swelling noted about the knee.  Assessment: Left comminuted patella fracture, closed  Plan: Plan to proceed with open reduction internal fixation of left patella fracture.  We had previously discussed this in the office.  Here today for that surgery.  No new complaints at this time.  Together we discussed the risk and benefits of the procedure which include but are not limited to bleeding, infection, damage to surrounding nerves and vessels, stiffness, painful hardware, need for removal of hardware, the risk of anesthesia as well as DVT risk.  She has provided informed consent.  Plan for discharge home postoperatively from PACU.    Yolonda Kida, MD Cell [phone]    07/22/2023 9:08 AM

## 2023-07-22 NOTE — Anesthesia Preprocedure Evaluation (Addendum)
Anesthesia Evaluation  Patient identified by MRN, date of birth, ID band Patient awake    Reviewed: Allergy & Precautions, NPO status , Patient's Chart, lab work & pertinent test results  History of Anesthesia Complications Negative for: history of anesthetic complications  Airway Mallampati: I  TM Distance: >3 FB Neck ROM: Full    Dental  (+) Dental Advisory Given   Pulmonary sleep apnea (Inspire)    breath sounds clear to auscultation       Cardiovascular negative cardio ROS  Rhythm:Regular Rate:Normal     Neuro/Psych  Headaches    GI/Hepatic Neg liver ROS,GERD  Medicated and Controlled,,  Endo/Other  wegovy  Renal/GU negative Renal ROS     Musculoskeletal  (+) Arthritis , Rheumatoid disorders,    Abdominal   Peds  Hematology negative hematology ROS (+)   Anesthesia Other Findings   Reproductive/Obstetrics                             Anesthesia Physical Anesthesia Plan  ASA: 2  Anesthesia Plan: General   Post-op Pain Management: Regional block* and Tylenol PO (pre-op)*   Induction: Intravenous  PONV Risk Score and Plan: 3 and Ondansetron, Dexamethasone and Scopolamine patch - Pre-op  Airway Management Planned: LMA  Additional Equipment: None  Intra-op Plan:   Post-operative Plan:   Informed Consent: I have reviewed the patients History and Physical, chart, labs and discussed the procedure including the risks, benefits and alternatives for the proposed anesthesia with the patient or authorized representative who has indicated his/her understanding and acceptance.     Dental advisory given  Plan Discussed with: CRNA and Surgeon  Anesthesia Plan Comments: (Plan routine monitors, GA with adductor canal block for post op analgesia)        Anesthesia Quick Evaluation

## 2023-07-22 NOTE — Op Note (Addendum)
07/22/2023  12:32 PM  PATIENT:  Toni Atkinson    PRE-OPERATIVE DIAGNOSIS:  Comminuted fracture of patella left  POST-OPERATIVE DIAGNOSIS:  Same  PROCEDURE:  OPEN REDUCTION INTERNAL FIXATION (ORIF) PATELLA  SURGEON:  Yolonda Kida, MD  PHYSICIAN ASSISTANT: Dion Saucier, PA-C, present and scrubbed throughout the case, critical for completion in a timely fashion, and for retraction, instrumentation, and closure.  ANESTHESIA:   General with adductor canal regional  PREOPERATIVE INDICATIONS:  Toni Atkinson is a  52 y.o. female with a diagnosis of Comminuted fracture of patella left who elected for surgical management in order to restore the function of the extensor mechanism.    The risks benefits and alternatives were discussed with the patient preoperatively including but not limited to the risks of infection, bleeding, nerve injury, cardiopulmonary complications, the need for revision surgery, hardware prominence, hardware failure, the need for hardware removal, nonunion, malunion, posttraumatic arthritis, stiffness, loss of strength and function, among others, and the patient was willing to proceed.  OPERATIVE IMPLANTS:  Arthrex star shape small dorsal patellar plate with 10 locking screws  OPERATIVE FINDINGS: Displaced patella fracture, three-part with articular step-off and diastases  OPERATIVE PROCEDURE: The patient was brought to the operating room and placed in the supine position. General anesthesia was administered. IV antibiotics were given. The lower extremity was prepped and draped in usual sterile fashion. The leg was elevated and exsanguinated and the tourniquet was inflated. Time out was performed.   Anterior incision was made over the patella and the fracture fragments identified and cleaned of hematoma. The retinaculum was not torn on either side.   Next, we elevated periosteal flaps superior and inferior such that we could identify the fracture lines on the dorsal  surface of the patella.  There was a primary component that was a 2 part fracture superior and inferior fragments in a classic transverse orientation.  However there was a chevron shaped lateral component that did have articular cartilage as well.  We first reduced the primary 2 components with a clamp placed superior to inferior.  We then placed a lateral to medial clamp and utilized a freer elevator along the articular surface as well as utilizing intraoperative fluoroscopy to reduce the articular fragments.  We are satisfied with the reduction.  We then trialed our plates and found a small sized true star-shaped plate to fit appropriately well.    C-arm used to confirm reduction and position of the screws, and once I was satisfied with this I then placed all screws and locking mode with 4 screws in the proximal fragment, 1 screw in the chevron fragment and 5 screws in the distal block.  The wounds were irrigated copiously and the periosteum repaired with #1 Vicryl followed by Vicryl for the subcutaneous tissue, and running subcuticular Monocryl for skin, with Steri-Strips and sterile gauze for the skin. The wounds were also injected. A knee Bledsoe brace was applied. The patient was awakened and returned to the PACU in stable and satisfactory condition. There were no complications.   Disposition:  Toni Atkinson will be weightbearing up to full WBAT with her Bledsoe brace locked in full extension for the first 2 weeks.  She will use assistive devices until then.  She will do progressive range of motion with outpatient therapy.  Discharge home today from PACU.  She will take 81 mg aspirin once per day x 6 weeks for DVT prophylaxis.  I will see her in the office in 2 weeks with 2 x-ray views  of the left knee AP and lateral.

## 2023-07-22 NOTE — Anesthesia Procedure Notes (Signed)
Procedure Name: LMA Insertion Date/Time: 07/22/2023 11:41 AM  Performed by: Demetrio Lapping, CRNAPre-anesthesia Checklist: Patient identified, Emergency Drugs available, Suction available and Patient being monitored Patient Re-evaluated:Patient Re-evaluated prior to induction Oxygen Delivery Method: Circle System Utilized Preoxygenation: Pre-oxygenation with 100% oxygen Induction Type: IV induction Ventilation: Mask ventilation without difficulty LMA: LMA inserted LMA Size: 4.0 Number of attempts: 1 Airway Equipment and Method: Bite block Placement Confirmation: positive ETCO2 Tube secured with: Tape Dental Injury: Teeth and Oropharynx as per pre-operative assessment

## 2023-07-22 NOTE — Progress Notes (Signed)
Orthopedic Tech Progress Note Patient Details:  Toni Atkinson [birth year] [id number] [facility]soe brace [facility] Patient ID: [name], female   DOB: November 26, 1971, 52 y.o.   MRN: [id number]  Lovett Calender 07/22/2023, 11:36 AM

## 2023-07-22 NOTE — Anesthesia Postprocedure Evaluation (Signed)
Anesthesia Post Note  Patient: Toni Atkinson  Procedure(s) Performed: OPEN REDUCTION INTERNAL FIXATION (ORIF) PATELLA (Left: Knee)     Patient location during evaluation: Phase II Anesthesia Type: General Level of consciousness: awake and alert, patient cooperative and oriented Pain management: pain level controlled Vital Signs Assessment: post-procedure vital signs reviewed and stable Respiratory status: spontaneous breathing, nonlabored ventilation and respiratory function stable Cardiovascular status: blood pressure returned to baseline and stable Postop Assessment: no apparent nausea or vomiting and adequate PO intake Anesthetic complications: no   No notable events documented.  Last Vitals:  Vitals:   07/22/23 1322 07/22/23 1330  BP: 135/87 (!) 144/81  Pulse: 98 96  Resp: 16 18  Temp:  (!) 36.1 C  SpO2: 97% 97%    Last Pain:  Vitals:   07/22/23 1330  TempSrc:   PainSc: 0-No pain                 Loki Wuthrich,E. Gaddiel Cullens

## 2023-07-22 NOTE — Progress Notes (Signed)
Assisted Dr. Annye Asa with left, adductor canal, ultrasound guided block. Side rails up, monitors on throughout procedure. See vital signs in flow sheet. Tolerated Procedure well. ?

## 2023-07-25 ENCOUNTER — Encounter (HOSPITAL_BASED_OUTPATIENT_CLINIC_OR_DEPARTMENT_OTHER): Payer: Self-pay | Admitting: Orthopedic Surgery

## 2023-07-25 NOTE — Progress Notes (Signed)
A wallet was found in pre op room 7 on Friday July 22, 2023. I was informed of it the following Monday morning. Inside the wallet was [place] drivers license, SS card, debit card and credit card. No cash present, this was witnessed by Rand, NT. The name on the ID did not match any of our pts on Friday, however after looking at the pre op call section I found the name listed on the ID in this pts chart as the person who would be driving this pt home. I have called and spoke directly with the pt who verified this was her boyfriend and was able to confirm with name address on the ID. I will keep the wallet locked in my office until he comes to pick it up.

## 2023-07-25 NOTE — Progress Notes (Signed)
Wallet returned to pts boyfriend.

## 2023-07-26 ENCOUNTER — Other Ambulatory Visit: Payer: Self-pay

## 2023-08-04 DIAGNOSIS — Z4789 Encounter for other orthopedic aftercare: Secondary | ICD-10-CM | POA: Diagnosis not present

## 2023-08-09 DIAGNOSIS — M25662 Stiffness of left knee, not elsewhere classified: Secondary | ICD-10-CM | POA: Diagnosis not present

## 2023-08-09 DIAGNOSIS — M25562 Pain in left knee: Secondary | ICD-10-CM | POA: Diagnosis not present

## 2023-08-11 DIAGNOSIS — M25662 Stiffness of left knee, not elsewhere classified: Secondary | ICD-10-CM | POA: Diagnosis not present

## 2023-08-11 DIAGNOSIS — M25562 Pain in left knee: Secondary | ICD-10-CM | POA: Diagnosis not present

## 2023-08-16 DIAGNOSIS — M25562 Pain in left knee: Secondary | ICD-10-CM | POA: Diagnosis not present

## 2023-08-18 DIAGNOSIS — M25562 Pain in left knee: Secondary | ICD-10-CM | POA: Diagnosis not present

## 2023-08-18 DIAGNOSIS — M25662 Stiffness of left knee, not elsewhere classified: Secondary | ICD-10-CM | POA: Diagnosis not present

## 2023-08-29 DIAGNOSIS — M25562 Pain in left knee: Secondary | ICD-10-CM | POA: Diagnosis not present

## 2023-08-29 DIAGNOSIS — M25662 Stiffness of left knee, not elsewhere classified: Secondary | ICD-10-CM | POA: Diagnosis not present

## 2023-08-30 ENCOUNTER — Encounter: Payer: Self-pay | Admitting: Adult Health

## 2023-08-31 DIAGNOSIS — M25562 Pain in left knee: Secondary | ICD-10-CM | POA: Diagnosis not present

## 2023-08-31 DIAGNOSIS — M25662 Stiffness of left knee, not elsewhere classified: Secondary | ICD-10-CM | POA: Diagnosis not present

## 2023-09-01 DIAGNOSIS — Z4789 Encounter for other orthopedic aftercare: Secondary | ICD-10-CM | POA: Diagnosis not present

## 2023-09-05 DIAGNOSIS — M25562 Pain in left knee: Secondary | ICD-10-CM | POA: Diagnosis not present

## 2023-09-09 DIAGNOSIS — M25662 Stiffness of left knee, not elsewhere classified: Secondary | ICD-10-CM | POA: Diagnosis not present

## 2023-09-09 DIAGNOSIS — M25562 Pain in left knee: Secondary | ICD-10-CM | POA: Diagnosis not present

## 2023-09-13 DIAGNOSIS — M25562 Pain in left knee: Secondary | ICD-10-CM | POA: Diagnosis not present

## 2023-09-14 DIAGNOSIS — M25561 Pain in right knee: Secondary | ICD-10-CM | POA: Diagnosis not present

## 2023-09-15 DIAGNOSIS — M25562 Pain in left knee: Secondary | ICD-10-CM | POA: Diagnosis not present

## 2023-09-15 DIAGNOSIS — M25662 Stiffness of left knee, not elsewhere classified: Secondary | ICD-10-CM | POA: Diagnosis not present

## 2023-09-20 DIAGNOSIS — M069 Rheumatoid arthritis, unspecified: Secondary | ICD-10-CM | POA: Diagnosis not present

## 2023-09-20 DIAGNOSIS — F3341 Major depressive disorder, recurrent, in partial remission: Secondary | ICD-10-CM | POA: Diagnosis not present

## 2023-09-20 DIAGNOSIS — G43009 Migraine without aura, not intractable, without status migrainosus: Secondary | ICD-10-CM | POA: Diagnosis not present

## 2023-09-20 DIAGNOSIS — M25662 Stiffness of left knee, not elsewhere classified: Secondary | ICD-10-CM | POA: Diagnosis not present

## 2023-09-20 DIAGNOSIS — M25562 Pain in left knee: Secondary | ICD-10-CM | POA: Diagnosis not present

## 2023-09-20 DIAGNOSIS — E669 Obesity, unspecified: Secondary | ICD-10-CM | POA: Diagnosis not present

## 2023-09-21 DIAGNOSIS — E663 Overweight: Secondary | ICD-10-CM | POA: Diagnosis not present

## 2023-09-21 DIAGNOSIS — Z6826 Body mass index (BMI) 26.0-26.9, adult: Secondary | ICD-10-CM | POA: Diagnosis not present

## 2023-09-21 DIAGNOSIS — M79641 Pain in right hand: Secondary | ICD-10-CM | POA: Diagnosis not present

## 2023-09-21 DIAGNOSIS — R5383 Other fatigue: Secondary | ICD-10-CM | POA: Diagnosis not present

## 2023-09-21 DIAGNOSIS — M79642 Pain in left hand: Secondary | ICD-10-CM | POA: Diagnosis not present

## 2023-09-21 DIAGNOSIS — M059 Rheumatoid arthritis with rheumatoid factor, unspecified: Secondary | ICD-10-CM | POA: Diagnosis not present

## 2023-09-21 DIAGNOSIS — M810 Age-related osteoporosis without current pathological fracture: Secondary | ICD-10-CM | POA: Diagnosis not present

## 2023-09-21 DIAGNOSIS — Z79899 Other long term (current) drug therapy: Secondary | ICD-10-CM | POA: Diagnosis not present

## 2023-09-22 DIAGNOSIS — M25562 Pain in left knee: Secondary | ICD-10-CM | POA: Diagnosis not present

## 2023-09-22 DIAGNOSIS — M25662 Stiffness of left knee, not elsewhere classified: Secondary | ICD-10-CM | POA: Diagnosis not present

## 2023-09-28 DIAGNOSIS — M25562 Pain in left knee: Secondary | ICD-10-CM | POA: Diagnosis not present

## 2023-09-28 DIAGNOSIS — M25662 Stiffness of left knee, not elsewhere classified: Secondary | ICD-10-CM | POA: Diagnosis not present

## 2023-10-07 DIAGNOSIS — M25562 Pain in left knee: Secondary | ICD-10-CM | POA: Diagnosis not present

## 2023-10-07 DIAGNOSIS — M25662 Stiffness of left knee, not elsewhere classified: Secondary | ICD-10-CM | POA: Diagnosis not present

## 2023-10-11 DIAGNOSIS — Z79899 Other long term (current) drug therapy: Secondary | ICD-10-CM | POA: Diagnosis not present

## 2023-10-11 DIAGNOSIS — M25562 Pain in left knee: Secondary | ICD-10-CM | POA: Diagnosis not present

## 2023-10-11 DIAGNOSIS — M79642 Pain in left hand: Secondary | ICD-10-CM | POA: Diagnosis not present

## 2023-10-11 DIAGNOSIS — M25662 Stiffness of left knee, not elsewhere classified: Secondary | ICD-10-CM | POA: Diagnosis not present

## 2023-10-11 DIAGNOSIS — M79641 Pain in right hand: Secondary | ICD-10-CM | POA: Diagnosis not present

## 2023-10-11 DIAGNOSIS — M059 Rheumatoid arthritis with rheumatoid factor, unspecified: Secondary | ICD-10-CM | POA: Diagnosis not present

## 2023-10-13 DIAGNOSIS — M25562 Pain in left knee: Secondary | ICD-10-CM | POA: Diagnosis not present

## 2023-10-13 DIAGNOSIS — M25662 Stiffness of left knee, not elsewhere classified: Secondary | ICD-10-CM | POA: Diagnosis not present

## 2023-10-19 DIAGNOSIS — M25662 Stiffness of left knee, not elsewhere classified: Secondary | ICD-10-CM | POA: Diagnosis not present

## 2023-10-19 DIAGNOSIS — M25562 Pain in left knee: Secondary | ICD-10-CM | POA: Diagnosis not present

## 2023-10-21 DIAGNOSIS — M25562 Pain in left knee: Secondary | ICD-10-CM | POA: Diagnosis not present

## 2023-10-25 DIAGNOSIS — M25562 Pain in left knee: Secondary | ICD-10-CM | POA: Diagnosis not present

## 2023-10-25 DIAGNOSIS — M25662 Stiffness of left knee, not elsewhere classified: Secondary | ICD-10-CM | POA: Diagnosis not present

## 2023-12-06 DIAGNOSIS — Z20822 Contact with and (suspected) exposure to covid-19: Secondary | ICD-10-CM | POA: Diagnosis not present

## 2023-12-06 DIAGNOSIS — J029 Acute pharyngitis, unspecified: Secondary | ICD-10-CM | POA: Diagnosis not present

## 2023-12-06 DIAGNOSIS — R051 Acute cough: Secondary | ICD-10-CM | POA: Diagnosis not present

## 2023-12-20 DIAGNOSIS — M810 Age-related osteoporosis without current pathological fracture: Secondary | ICD-10-CM | POA: Diagnosis not present

## 2023-12-20 DIAGNOSIS — M79641 Pain in right hand: Secondary | ICD-10-CM | POA: Diagnosis not present

## 2023-12-20 DIAGNOSIS — M79642 Pain in left hand: Secondary | ICD-10-CM | POA: Diagnosis not present

## 2023-12-20 DIAGNOSIS — M059 Rheumatoid arthritis with rheumatoid factor, unspecified: Secondary | ICD-10-CM | POA: Diagnosis not present

## 2023-12-29 DIAGNOSIS — J111 Influenza due to unidentified influenza virus with other respiratory manifestations: Secondary | ICD-10-CM | POA: Diagnosis not present

## 2023-12-29 DIAGNOSIS — Z20822 Contact with and (suspected) exposure to covid-19: Secondary | ICD-10-CM | POA: Diagnosis not present

## 2024-01-02 DIAGNOSIS — R062 Wheezing: Secondary | ICD-10-CM | POA: Diagnosis not present

## 2024-01-02 DIAGNOSIS — R0602 Shortness of breath: Secondary | ICD-10-CM | POA: Diagnosis not present

## 2024-01-17 ENCOUNTER — Telehealth: Payer: Self-pay | Admitting: Adult Health

## 2024-01-17 ENCOUNTER — Ambulatory Visit: Payer: BC Managed Care – PPO | Admitting: Adult Health

## 2024-01-17 ENCOUNTER — Encounter: Payer: Self-pay | Admitting: Adult Health

## 2024-01-17 ENCOUNTER — Telehealth (INDEPENDENT_AMBULATORY_CARE_PROVIDER_SITE_OTHER): Payer: BC Managed Care – PPO | Admitting: Adult Health

## 2024-01-17 VITALS — Ht 68.5 in | Wt 180.0 lb

## 2024-01-17 DIAGNOSIS — G4733 Obstructive sleep apnea (adult) (pediatric): Secondary | ICD-10-CM

## 2024-01-17 DIAGNOSIS — R32 Unspecified urinary incontinence: Secondary | ICD-10-CM | POA: Insufficient documentation

## 2024-01-17 DIAGNOSIS — F419 Anxiety disorder, unspecified: Secondary | ICD-10-CM | POA: Insufficient documentation

## 2024-01-17 DIAGNOSIS — F3341 Major depressive disorder, recurrent, in partial remission: Secondary | ICD-10-CM | POA: Insufficient documentation

## 2024-01-17 DIAGNOSIS — G43909 Migraine, unspecified, not intractable, without status migrainosus: Secondary | ICD-10-CM | POA: Insufficient documentation

## 2024-01-17 NOTE — Progress Notes (Signed)
 Virtual Visit via Video Note  I connected with Toni Atkinson on 01/17/24 at 11:00 AM EST by a video enabled telemedicine application and verified that I am speaking with the correct person using two identifiers.  Location: Patient: Home  Provider: Office    I discussed the limitations of evaluation and management by telemedicine and the availability of in person appointments. The patient expressed understanding and agreed to proceed.  History of Present Illness: 53 year old female seen for sleep consult March 08, 2023 to establish for sleep apnea status post inspire implantation Inspire implantation on February 02, 2023, inspire activation March 08, 2023 Medical history significant for rheumatoid arthritis  Today's video visit is a 91-month follow-up for sleep apnea with inspire device.  As above patient underwent inspire implantation February 02, 2023.  Inspire activation was on March 08, 2023.  Inspire titration study was June 22, 2023 with optimal control at 2.2 V with a AHI at 0.6/hour.  Patient says she is doing well on inspire.  Uses it each night.  Typically gets in about 7 hours of sleep using inspire.  Does feel that she has benefited from inspire with decreased daytime sleepiness and a more restful sleep.  Inspire download shows excellent compliance with daily average usage at 7.5 hours.  Current voltage at 2.1 V.  Sleep delay at 60 minutes, duration 9 hours, 30-minute pause.  Range is 2.0 to 2.4 V. Patient denies any trouble swallowing or speech changes.  Feels overall she is doing well and is happy with Inspire.   Last visit had a fall with a left patella fracture says that she is completely recovered from this.    Past Medical History:  Diagnosis Date   Anxiety and depression    GERD (gastroesophageal reflux disease)    Migraine    Osteoporosis    Patella fracture    left   RA (rheumatoid arthritis) (HCC)    RLS (restless legs syndrome)    Sleep apnea    Inspire   Current Outpatient  Medications on File Prior to Visit  Medication Sig Dispense Refill   acyclovir (ZOVIRAX) 200 MG capsule Take 200 mg by mouth at bedtime.     alendronate (FOSAMAX) 70 MG tablet Take 70 mg by mouth once a week. Take with a full glass of water on an empty stomach.     busPIRone (BUSPAR) 10 MG tablet Take 10 mg by mouth 2 (two) times daily.     CALCIUM PO Take 500 mg by mouth 2 (two) times daily.     cyclobenzaprine (FLEXERIL) 10 MG tablet Take 10 mg by mouth at bedtime.     FLUoxetine (PROZAC) 20 MG capsule Take 60 mg by mouth every morning.     gabapentin (NEURONTIN) 300 MG capsule Take 300-1,200 mg by mouth See admin instructions. Take 300 mg in the morning and the afternoon and 1200 mg at bedtime     NURTEC 75 MG TBDP Take 75 mg by mouth daily as needed (migraine).     omeprazole (PRILOSEC) 20 MG capsule Take 20 mg by mouth daily.     Tofacitinib Citrate ER (XELJANZ XR) 11 MG TB24 TAKE ONE TABLET BY MOUTH ONCE DAILY. MAY BE TAKEN WITH OR WITHOUT FOOD. SWALLOW TABLET WHOLE. DO NOT CRUSH, SPLIT OR CHEW. STORE AT ROOM TEMPERATURE.     budesonide-formoterol (SYMBICORT) 80-4.5 MCG/ACT inhaler Inhale 2 puffs into the lungs. (Patient not taking: Reported on 01/17/2024)     fluticasone (FLONASE) 50 MCG/ACT nasal spray Place 1  spray into both nostrils daily.     No current facility-administered medications on file prior to visit.      Observations/Objective: 01/17/2024 -appears well in no acute distress  Inspire  Sleep study done on 12/02/20 that showed moderate OSA with AHI 17/hr, SPo2 low at 85%.  CPAP intolerant -09/08/22 ENT  for evaluation for hypoglossal nerve stimulator.  -10/2022  Underwent drug induced sleep endoscopy -that showed anterior to posterior obstruction, candidate for Inspire.  -02/02/23 Hypoglossal nerve stimulator implantation -03/08/23 INSPIRE activation-Stimulation with sensation level at 1.2 V and functional level at 1.4 V.  Patient will be on a range of 1.4-2.4.  Start delay will  be 60 minutes.  Pause time will be 15 minutes ,  duration will be 9 hours.  -inspire titration on June 22, 2023 this showed optimal control at 2.2 V. (AHI 0.6).   Assessment and Plan: Moderate obstructive sleep apnea-doing very well on inspire device.  Compliance report is excellent.  She is continue on inspire each night.  Currently on 2.1 V/level 2.  Has perceived benefit. Will check home sleep study in 6 months on inspire with follow-up afterwards to discuss sleep study results  Plan  Patient Instructions  Continue on INSPIRE each night .  Healthy sleep regimen  Do not drive if sleepy  Home sleep study in August  Follow up in 6 months with Dr. Wynona Neat or Martin Belling NP and  As needed  -In person.     Follow Up Instructions:    I discussed the assessment and treatment plan with the patient. The patient was provided an opportunity to ask questions and all were answered. The patient agreed with the plan and demonstrated an understanding of the instructions.   The patient was advised to call back or seek an in-person evaluation if the symptoms worsen or if the condition fails to improve as anticipated.  I provided 21  minutes of non-face-to-face time during this encounter.   Rubye Oaks, NP

## 2024-01-17 NOTE — Patient Instructions (Addendum)
 Continue on INSPIRE each night .  Healthy sleep regimen  Do not drive if sleepy  Home sleep study in August  Follow up in 6 months with Dr. Wynona Neat or Wlliam Grosso NP and  As needed  -In person. (INSPIRE team visit )

## 2024-01-18 NOTE — Telephone Encounter (Signed)
LM on pt's VM to schedule.  

## 2024-02-01 DIAGNOSIS — G43909 Migraine, unspecified, not intractable, without status migrainosus: Secondary | ICD-10-CM | POA: Diagnosis not present

## 2024-02-01 DIAGNOSIS — G2581 Restless legs syndrome: Secondary | ICD-10-CM | POA: Diagnosis not present

## 2024-02-01 DIAGNOSIS — M069 Rheumatoid arthritis, unspecified: Secondary | ICD-10-CM | POA: Diagnosis not present

## 2024-02-01 DIAGNOSIS — F3341 Major depressive disorder, recurrent, in partial remission: Secondary | ICD-10-CM | POA: Diagnosis not present

## 2024-02-10 NOTE — Telephone Encounter (Signed)
 No answer or returned phone call.  Recall placed.  Nothing further needed at this time

## 2024-03-27 DIAGNOSIS — M79642 Pain in left hand: Secondary | ICD-10-CM | POA: Diagnosis not present

## 2024-03-27 DIAGNOSIS — M059 Rheumatoid arthritis with rheumatoid factor, unspecified: Secondary | ICD-10-CM | POA: Diagnosis not present

## 2024-03-27 DIAGNOSIS — Z79899 Other long term (current) drug therapy: Secondary | ICD-10-CM | POA: Diagnosis not present

## 2024-03-27 DIAGNOSIS — M79641 Pain in right hand: Secondary | ICD-10-CM | POA: Diagnosis not present

## 2024-04-11 DIAGNOSIS — F3341 Major depressive disorder, recurrent, in partial remission: Secondary | ICD-10-CM | POA: Diagnosis not present

## 2024-04-11 DIAGNOSIS — E7800 Pure hypercholesterolemia, unspecified: Secondary | ICD-10-CM | POA: Diagnosis not present

## 2024-04-11 DIAGNOSIS — M069 Rheumatoid arthritis, unspecified: Secondary | ICD-10-CM | POA: Diagnosis not present

## 2024-04-11 DIAGNOSIS — Z0000 Encounter for general adult medical examination without abnormal findings: Secondary | ICD-10-CM | POA: Diagnosis not present

## 2024-04-11 DIAGNOSIS — G2581 Restless legs syndrome: Secondary | ICD-10-CM | POA: Diagnosis not present

## 2024-04-11 DIAGNOSIS — R5383 Other fatigue: Secondary | ICD-10-CM | POA: Diagnosis not present

## 2024-04-11 DIAGNOSIS — G43009 Migraine without aura, not intractable, without status migrainosus: Secondary | ICD-10-CM | POA: Diagnosis not present

## 2024-05-01 NOTE — Telephone Encounter (Signed)
 She is an inspire patient and is due for another study in 06/2024

## 2024-05-11 DIAGNOSIS — R42 Dizziness and giddiness: Secondary | ICD-10-CM | POA: Diagnosis not present

## 2024-05-17 ENCOUNTER — Telehealth: Payer: Self-pay | Admitting: Student

## 2024-05-17 NOTE — Telephone Encounter (Signed)
 PT calling about a bill she got for her inspire. States the [employer] told her the pre Siegfried Dress was not done right. Lynder Sanger said he would let [place] know and she would call her back.

## 2024-05-17 NOTE — Telephone Encounter (Signed)
 Patient is calling concerning the issue with the 2800 dollar bill from the inspire titration she had done last year I believe. Called cal  and got patient transferred to [place]

## 2024-05-17 NOTE — Telephone Encounter (Signed)
 I have reached out to Carlee Charters & [facility] for some direction with this before reaching out to the patient.

## 2024-06-12 DIAGNOSIS — Z1211 Encounter for screening for malignant neoplasm of colon: Secondary | ICD-10-CM | POA: Diagnosis not present

## 2024-06-19 ENCOUNTER — Telehealth: Payer: Self-pay | Admitting: *Deleted

## 2024-06-19 NOTE — Telephone Encounter (Unsigned)
 Copied from CRM [id number]. Topic: General - Other >> Jun 19, 2024  9:29 AM Corean SAUNDERS wrote: Reason for CRM: Patient is requesting a return call from [facility].

## 2024-06-20 NOTE — Telephone Encounter (Signed)
 Is there an update for this patient?

## 2024-06-21 NOTE — Telephone Encounter (Signed)
 Per Email from [place]:

## 2024-06-21 NOTE — Telephone Encounter (Signed)
 Spoke to pt & made her aware per Caron, that this has been paid in full.  Pt verbalized understanding & nothing further needed at this time.   Below is copied from e-mail I received from [place]:

## 2024-06-21 NOTE — Telephone Encounter (Signed)
 Per Caron via email, Per the team that handles this see below:[employer] reprocessed claim and paid on 06/20/2024. Zero patient liability

## 2024-06-28 DIAGNOSIS — M059 Rheumatoid arthritis with rheumatoid factor, unspecified: Secondary | ICD-10-CM | POA: Diagnosis not present

## 2024-06-28 DIAGNOSIS — Z79899 Other long term (current) drug therapy: Secondary | ICD-10-CM | POA: Diagnosis not present

## 2024-06-28 DIAGNOSIS — M810 Age-related osteoporosis without current pathological fracture: Secondary | ICD-10-CM | POA: Diagnosis not present

## 2024-07-17 ENCOUNTER — Ambulatory Visit

## 2024-07-17 DIAGNOSIS — G4733 Obstructive sleep apnea (adult) (pediatric): Secondary | ICD-10-CM

## 2024-07-26 DIAGNOSIS — Z1151 Encounter for screening for human papillomavirus (HPV): Secondary | ICD-10-CM | POA: Diagnosis not present

## 2024-07-26 DIAGNOSIS — Z124 Encounter for screening for malignant neoplasm of cervix: Secondary | ICD-10-CM | POA: Diagnosis not present

## 2024-07-26 DIAGNOSIS — Z01419 Encounter for gynecological examination (general) (routine) without abnormal findings: Secondary | ICD-10-CM | POA: Diagnosis not present

## 2024-07-26 DIAGNOSIS — N951 Menopausal and female climacteric states: Secondary | ICD-10-CM | POA: Diagnosis not present

## 2024-08-02 ENCOUNTER — Telehealth: Payer: Self-pay | Admitting: Pulmonary Disease

## 2024-08-02 DIAGNOSIS — G4733 Obstructive sleep apnea (adult) (pediatric): Secondary | ICD-10-CM | POA: Diagnosis not present

## 2024-08-02 NOTE — Telephone Encounter (Signed)
 Pt.notified

## 2024-08-02 NOTE — Telephone Encounter (Signed)
 Call patient  Sleep study result  Date of study: 07/26/2024  Impression:  Mild obstructive sleep apnea with an AHI of 8, Mild snoring, no significant oxygen desaturations  Recommendation: Continue to optimize amplitude of inspire device  Clinical follow-up of symptoms

## 2024-08-02 NOTE — Telephone Encounter (Signed)
 Will discuss at ov on 08/17/24

## 2024-08-17 ENCOUNTER — Encounter: Payer: Self-pay | Admitting: Adult Health

## 2024-08-17 ENCOUNTER — Ambulatory Visit: Admitting: Adult Health

## 2024-08-17 VITALS — BP 118/80 | HR 101 | Ht 68.5 in | Wt 186.4 lb

## 2024-08-17 DIAGNOSIS — G4733 Obstructive sleep apnea (adult) (pediatric): Secondary | ICD-10-CM

## 2024-08-17 DIAGNOSIS — G4709 Other insomnia: Secondary | ICD-10-CM

## 2024-08-17 NOTE — Patient Instructions (Signed)
 Use INSPIRE each night  Keep up good work.  Continue on current level. Adjust as needed.  Set up home sleep study prior to next visit in 1 year  Follow up in 1 year with Dr. Neda or Bethene Hankinson NP and As needed

## 2024-08-17 NOTE — Progress Notes (Signed)
 @Patient  ID: Toni Atkinson, female    DOB: August 30, 1971, 53 y.o.   MRN: [id number]  Chief Complaint  Patient presents with   Sleep Apnea    Referring provider: Chrystal Lamarr RAMAN, *  HPI: 53 year old female seen for sleep consult April 2024 to establish for sleep apnea status post INSPIRE Implantation  Inspire implantation on February 02, 2023, inspire activation March 08, 2023 Medical history significant for rheumatoid arthritis  TEST/EVENTS :  inspire implantation February 02, 2023.  Inspire activation was on March 08, 2023.  Inspire titration study was June 22, 2023 with optimal control at 2.2 V with a AHI at 0.6/hour.   08/17/2024 Follow up : OSA /INSPIRE  Discussed the use of AI scribe software for clinical note transcription with the patient, who gave verbal consent to proceed.  History of Present Illness Toni Atkinson is a 53 year old female with sleep apnea who presents for a six-month follow-up for sleep apnea with the inspire device  She uses the [facility] device consistently every night for about eight hours without any issues and feels rested and pleased with its performance.  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 She experiences insomnia and wakes up during the night. She is currently undergoing Transcranial Magnetic Stimulation (TMS) for her depression.  She reports that her device settings were changed from 2.1 volts to 2.2 volts after the home sleep test and she feels comfortable at this level.  Inspire compliance report shows excellent compliance with 100% usage.  Daily average usage at 8 hours.  Currently on 2.2 V, current range 2.0 to 2.4 V.  Stimulation test today in the office shows good tongue movement and perceived comfort. Home sleep study done on July 26, 2024 on inspire showed no significant oxygen desaturations mild residual events with AHI at 2.4-8.0/hour She works at [employer].    Allergies  Allergen Reactions   Bactrim  [Sulfamethoxazole-Trimethoprim] Other (See Comments)    Mouth blisters   Bupropion Rash    Other Reaction(s): dry mouth    Immunization History  Administered Date(s) Administered   PFIZER(Purple Top)SARS-COV-2 Vaccination 12/12/2020, 01/02/2021   PNEUMOCOCCAL CONJUGATE-20 03/19/2022   Tdap 03/29/2023    Past Medical History:  Diagnosis Date   Anxiety and depression    GERD (gastroesophageal reflux disease)    Migraine    Osteoporosis    Patella fracture    left   RA (rheumatoid arthritis) (HCC)    RLS (restless legs syndrome)    Sleep apnea    Inspire    Tobacco History: Social History   Tobacco Use  Smoking Status Never  Smokeless Tobacco Never   Counseling given: Not Answered   Outpatient Medications Prior to Visit  Medication Sig Dispense Refill   acyclovir (ZOVIRAX) 200 MG capsule Take 200 mg by mouth at bedtime.     alendronate (FOSAMAX) 70 MG tablet Take 70 mg by mouth once a week. Take with a full glass of water  on an empty stomach.     busPIRone (BUSPAR) 10 MG tablet Take 10 mg by mouth 2 (two) times daily.     CALCIUM PO Take 500 mg by mouth 2 (two) times daily.     cyclobenzaprine (FLEXERIL) 10 MG tablet Take 10 mg by mouth at bedtime.     FLUoxetine (PROZAC) 20 MG capsule Take 60 mg by mouth every morning.     fluticasone (FLONASE) 50 MCG/ACT nasal spray Place 1 spray into both nostrils daily.  gabapentin (NEURONTIN) 300 MG capsule Take 300-1,200 mg by mouth See admin instructions. Take 300 mg in the morning and the afternoon and 1200 mg at bedtime     NURTEC 75 MG TBDP Take 75 mg by mouth daily as needed (migraine).     omeprazole (PRILOSEC) 20 MG capsule Take 20 mg by mouth daily.     RINVOQ 15 MG TB24      budesonide-formoterol (SYMBICORT) 80-4.5 MCG/ACT inhaler Inhale 2 puffs into the lungs. (Patient not taking: Reported on 01/17/2024)     Tofacitinib Citrate ER (XELJANZ XR) 11 MG TB24 TAKE ONE TABLET BY MOUTH ONCE DAILY. MAY BE TAKEN WITH OR  WITHOUT FOOD. SWALLOW TABLET WHOLE. DO NOT CRUSH, SPLIT OR CHEW. STORE AT ROOM TEMPERATURE.     No facility-administered medications prior to visit.     Review of Systems:   Constitutional:   No  weight loss, night sweats,  Fevers, chills, fatigue, or  lassitude.  HEENT:   No headaches,  Difficulty swallowing,  Tooth/dental problems, or  Sore throat,                No sneezing, itching, ear ache, nasal congestion, post nasal drip,   CV:  No chest pain,  Orthopnea, PND, swelling in lower extremities, anasarca, dizziness, palpitations, syncope.   GI  No heartburn, indigestion, abdominal pain, nausea, vomiting, diarrhea, change in bowel habits, loss of appetite, bloody stools.   Resp: No shortness of breath with exertion or at rest.  No excess mucus, no productive cough,  No non-productive cough,  No coughing up of blood.  No change in color of mucus.  No wheezing.  No chest wall deformity  Skin: no rash or lesions.  GU: no dysuria, change in color of urine, no urgency or frequency.  No flank pain, no hematuria   MS:  No joint pain or swelling.  No decreased range of motion.  No back pain.    Physical Exam  BP 118/80 (BP Location: Left Arm, Patient Position: Sitting, Cuff Size: Normal)   Pulse (!) 101   Ht 5' 8.5 (1.74 m)   Wt 186 lb 6.4 oz (84.6 kg)   SpO2 97% Comment: RA  BMI 27.93 kg/m   GEN: A/Ox3; pleasant , NAD, well nourished    HEENT:  [place]/AT,  NOSE-clear, THROAT-clear, no lesions, no postnasal drip or exudate noted.   NECK:  Supple w/ fair ROM; no JVD; normal carotid impulses w/o bruits; no thyromegaly or nodules palpated; no lymphadenopathy.    RESP  Clear  P & A; w/o, wheezes/ rales/ or rhonchi. no accessory muscle use, no dullness to percussion  CARD:  RRR, no m/r/g, no peripheral edema, pulses intact, no cyanosis or clubbing.  GI:   Soft & nt; nml bowel sounds; no organomegaly or masses detected.   Musco: Warm bil, no deformities or joint swelling noted.    Neuro: alert, no focal deficits noted.    Skin: Warm, no lesions or rashes    Lab Results:  CBC  No results found for: PROBNP  Imaging: No results found.  Administration History     None           No data to display          No results found for: NITRICOXIDE      Assessment & Plan:   No problem-specific Assessment & Plan notes found for this encounter.  Assessment and Plan Assessment & Plan Obstructive sleep apnea, status post Inspire implant  Obstructive sleep apnea is well-controlled with the Inspire implant, used consistently for about eight hours nightly. A recent home sleep study shows mild apnea with 2 to 8 episodes/hr on average, and stable oxygen levels averaging 93%, not dropping below 90%. She is comfortable with the current setting . She reports satisfaction and no issues with the device. Continue using the Inspire implant nightly. Schedule a follow-up visit in one year, with a home sleep study one month prior to ensure optimal device function. She should contact the clinic if any issues arise or if her condition changes.  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 She is undergoing transcranial magnetic stimulation (TMS) for depression management. Continue current depression management, including TMS, and coordinate with the primary care provider for any necessary medication adjustments.  Plan  Patient Instructions  Use INSPIRE each night  Keep up good work.  Continue on current level. Adjust as needed.  Set up home sleep study prior to next visit in 1 year  Follow up in 1 year with Dr. Neda or Adelie Croswell NP and As needed         I spent  30  minutes dedicated to the care of this patient on the date of this encounter to include pre-visit review of records, face-to-face time with the patient discussing conditions above, post  visit ordering of testing, clinical documentation with the electronic health record, making appropriate referrals as documented, and communicating necessary findings to members of the patients care team.   Madelin Stank, NP 08/17/2024

## 2024-09-14 DIAGNOSIS — F332 Major depressive disorder, recurrent severe without psychotic features: Secondary | ICD-10-CM | POA: Diagnosis not present

## 2024-09-17 DIAGNOSIS — M059 Rheumatoid arthritis with rheumatoid factor, unspecified: Secondary | ICD-10-CM | POA: Diagnosis not present

## 2024-09-17 DIAGNOSIS — R5383 Other fatigue: Secondary | ICD-10-CM | POA: Diagnosis not present

## 2024-09-17 DIAGNOSIS — F332 Major depressive disorder, recurrent severe without psychotic features: Secondary | ICD-10-CM | POA: Diagnosis not present

## 2024-09-18 DIAGNOSIS — F332 Major depressive disorder, recurrent severe without psychotic features: Secondary | ICD-10-CM | POA: Diagnosis not present

## 2024-09-24 DIAGNOSIS — F332 Major depressive disorder, recurrent severe without psychotic features: Secondary | ICD-10-CM | POA: Diagnosis not present

## 2024-09-25 DIAGNOSIS — F332 Major depressive disorder, recurrent severe without psychotic features: Secondary | ICD-10-CM | POA: Diagnosis not present

## 2024-09-26 DIAGNOSIS — F332 Major depressive disorder, recurrent severe without psychotic features: Secondary | ICD-10-CM | POA: Diagnosis not present

## 2024-09-27 DIAGNOSIS — F332 Major depressive disorder, recurrent severe without psychotic features: Secondary | ICD-10-CM | POA: Diagnosis not present

## 2024-09-28 DIAGNOSIS — F332 Major depressive disorder, recurrent severe without psychotic features: Secondary | ICD-10-CM | POA: Diagnosis not present

## 2024-10-01 DIAGNOSIS — F332 Major depressive disorder, recurrent severe without psychotic features: Secondary | ICD-10-CM | POA: Diagnosis not present

## 2024-10-02 DIAGNOSIS — F332 Major depressive disorder, recurrent severe without psychotic features: Secondary | ICD-10-CM | POA: Diagnosis not present

## 2024-10-03 DIAGNOSIS — F332 Major depressive disorder, recurrent severe without psychotic features: Secondary | ICD-10-CM | POA: Diagnosis not present

## 2024-10-04 DIAGNOSIS — F332 Major depressive disorder, recurrent severe without psychotic features: Secondary | ICD-10-CM | POA: Diagnosis not present

## 2024-10-05 DIAGNOSIS — F332 Major depressive disorder, recurrent severe without psychotic features: Secondary | ICD-10-CM | POA: Diagnosis not present

## 2024-10-08 DIAGNOSIS — F332 Major depressive disorder, recurrent severe without psychotic features: Secondary | ICD-10-CM | POA: Diagnosis not present

## 2024-10-09 DIAGNOSIS — F332 Major depressive disorder, recurrent severe without psychotic features: Secondary | ICD-10-CM | POA: Diagnosis not present

## 2024-10-10 DIAGNOSIS — F332 Major depressive disorder, recurrent severe without psychotic features: Secondary | ICD-10-CM | POA: Diagnosis not present

## 2024-10-11 DIAGNOSIS — F332 Major depressive disorder, recurrent severe without psychotic features: Secondary | ICD-10-CM | POA: Diagnosis not present

## 2024-10-15 DIAGNOSIS — F332 Major depressive disorder, recurrent severe without psychotic features: Secondary | ICD-10-CM | POA: Diagnosis not present

## 2024-10-16 DIAGNOSIS — F332 Major depressive disorder, recurrent severe without psychotic features: Secondary | ICD-10-CM | POA: Diagnosis not present

## 2024-10-17 DIAGNOSIS — F332 Major depressive disorder, recurrent severe without psychotic features: Secondary | ICD-10-CM | POA: Diagnosis not present

## 2024-10-18 DIAGNOSIS — F332 Major depressive disorder, recurrent severe without psychotic features: Secondary | ICD-10-CM | POA: Diagnosis not present

## 2024-10-19 DIAGNOSIS — F332 Major depressive disorder, recurrent severe without psychotic features: Secondary | ICD-10-CM | POA: Diagnosis not present

## 2024-10-22 DIAGNOSIS — G43009 Migraine without aura, not intractable, without status migrainosus: Secondary | ICD-10-CM | POA: Diagnosis not present

## 2024-10-22 DIAGNOSIS — F332 Major depressive disorder, recurrent severe without psychotic features: Secondary | ICD-10-CM | POA: Diagnosis not present

## 2024-10-22 DIAGNOSIS — M069 Rheumatoid arthritis, unspecified: Secondary | ICD-10-CM | POA: Diagnosis not present

## 2024-10-22 DIAGNOSIS — G2581 Restless legs syndrome: Secondary | ICD-10-CM | POA: Diagnosis not present

## 2024-10-23 DIAGNOSIS — F332 Major depressive disorder, recurrent severe without psychotic features: Secondary | ICD-10-CM | POA: Diagnosis not present

## 2024-10-24 DIAGNOSIS — F332 Major depressive disorder, recurrent severe without psychotic features: Secondary | ICD-10-CM | POA: Diagnosis not present

## 2024-10-26 DIAGNOSIS — F332 Major depressive disorder, recurrent severe without psychotic features: Secondary | ICD-10-CM | POA: Diagnosis not present

## 2024-10-29 DIAGNOSIS — F332 Major depressive disorder, recurrent severe without psychotic features: Secondary | ICD-10-CM | POA: Diagnosis not present

## 2024-10-30 DIAGNOSIS — F332 Major depressive disorder, recurrent severe without psychotic features: Secondary | ICD-10-CM | POA: Diagnosis not present

## 2024-11-01 DIAGNOSIS — F332 Major depressive disorder, recurrent severe without psychotic features: Secondary | ICD-10-CM | POA: Diagnosis not present

## 2024-11-05 DIAGNOSIS — F332 Major depressive disorder, recurrent severe without psychotic features: Secondary | ICD-10-CM | POA: Diagnosis not present

## 2024-11-07 ENCOUNTER — Other Ambulatory Visit: Payer: Self-pay | Admitting: Family Medicine

## 2024-11-07 DIAGNOSIS — F332 Major depressive disorder, recurrent severe without psychotic features: Secondary | ICD-10-CM | POA: Diagnosis not present

## 2024-11-07 DIAGNOSIS — Z1231 Encounter for screening mammogram for malignant neoplasm of breast: Secondary | ICD-10-CM

## 2024-11-09 DIAGNOSIS — D045 Carcinoma in situ of skin of trunk: Secondary | ICD-10-CM | POA: Diagnosis not present

## 2024-11-09 DIAGNOSIS — F332 Major depressive disorder, recurrent severe without psychotic features: Secondary | ICD-10-CM | POA: Diagnosis not present

## 2024-11-09 DIAGNOSIS — Z85828 Personal history of other malignant neoplasm of skin: Secondary | ICD-10-CM | POA: Diagnosis not present

## 2024-11-09 DIAGNOSIS — D2261 Melanocytic nevi of right upper limb, including shoulder: Secondary | ICD-10-CM | POA: Diagnosis not present

## 2024-11-09 DIAGNOSIS — L570 Actinic keratosis: Secondary | ICD-10-CM | POA: Diagnosis not present

## 2024-11-09 DIAGNOSIS — L738 Other specified follicular disorders: Secondary | ICD-10-CM | POA: Diagnosis not present

## 2024-11-09 DIAGNOSIS — L821 Other seborrheic keratosis: Secondary | ICD-10-CM | POA: Diagnosis not present

## 2024-11-09 DIAGNOSIS — C44722 Squamous cell carcinoma of skin of right lower limb, including hip: Secondary | ICD-10-CM | POA: Diagnosis not present

## 2024-11-09 DIAGNOSIS — D0471 Carcinoma in situ of skin of right lower limb, including hip: Secondary | ICD-10-CM | POA: Diagnosis not present

## 2024-11-09 DIAGNOSIS — C44519 Basal cell carcinoma of skin of other part of trunk: Secondary | ICD-10-CM | POA: Diagnosis not present

## 2024-11-13 DIAGNOSIS — F332 Major depressive disorder, recurrent severe without psychotic features: Secondary | ICD-10-CM | POA: Diagnosis not present

## 2024-11-13 DIAGNOSIS — M810 Age-related osteoporosis without current pathological fracture: Secondary | ICD-10-CM | POA: Diagnosis not present

## 2024-11-13 DIAGNOSIS — M85852 Other specified disorders of bone density and structure, left thigh: Secondary | ICD-10-CM | POA: Diagnosis not present

## 2024-11-15 ENCOUNTER — Emergency Department (HOSPITAL_BASED_OUTPATIENT_CLINIC_OR_DEPARTMENT_OTHER)
Admission: EM | Admit: 2024-11-15 | Discharge: 2024-11-15 | Disposition: A | Discharge status: Home / Self Care | Attending: Emergency Medicine | Admitting: Emergency Medicine

## 2024-11-15 ENCOUNTER — Emergency Department (HOSPITAL_BASED_OUTPATIENT_CLINIC_OR_DEPARTMENT_OTHER): Admitting: Radiology

## 2024-11-15 ENCOUNTER — Other Ambulatory Visit: Payer: Self-pay

## 2024-11-15 DIAGNOSIS — Y9389 Activity, other specified: Secondary | ICD-10-CM | POA: Diagnosis not present

## 2024-11-15 DIAGNOSIS — W06XXXA Fall from bed, initial encounter: Secondary | ICD-10-CM | POA: Insufficient documentation

## 2024-11-15 DIAGNOSIS — S2241XA Multiple fractures of ribs, right side, initial encounter for closed fracture: Secondary | ICD-10-CM | POA: Diagnosis not present

## 2024-11-15 DIAGNOSIS — M546 Pain in thoracic spine: Secondary | ICD-10-CM | POA: Diagnosis not present

## 2024-11-15 DIAGNOSIS — S299XXA Unspecified injury of thorax, initial encounter: Secondary | ICD-10-CM | POA: Diagnosis not present

## 2024-11-15 NOTE — Discharge Instructions (Signed)
 1.  Take your meloxicam for the next week to help with pain.  You may take 1-2 Vicodin if needed for more severe pain.  You may take Robaxin as a muscle relaxer.  When your pain is improving, you may take extra strength Tylenol  with the meloxicam for additional pain control.  Do not combine Vicodin and Tylenol  as they both contain acetaminophen . 2.  Use the incentive spirometer as instructed. 3.  Return immediately if you have sudden or worsening shortness of breath, fever, productive cough or other concerning changes.

## 2024-11-15 NOTE — ED Notes (Signed)
 Pt instructed on Incentive Spirometer. Pt performed 10 times with 500-666ml results. Questions answered.  HR 74, RR 18, Sats 99% on RA. No distress or SOB noted.

## 2024-11-15 NOTE — ED Triage Notes (Signed)
 POV fell off bed. I was reaching for my dog and hit rt flank on foot of bed. +SOB, difficulty taking deep breaths. Reports having nerve stimulator in chest and neck

## 2024-11-15 NOTE — ED Provider Notes (Signed)
  EMERGENCY DEPARTMENT AT [facility] Provider Note   CSN: [id number] Arrival date & time: 11/15/24  9396     Patient presents with: Toni Atkinson is a 53 y.o. female.   HPI Patient was playing with her dog Wednesday night.  She caught her foot on a fan in her room and fell onto the footboard of her bed.  She landed on her right mid back and side.  She reports it was pretty painful but was not too severe at the time.  No other injury sustained.  Over the course of the next day, pain escalated significantly.  She has a lot of pain with inspiration or movement.  Pain is improved by holding her arm firmly against her side.  Patient denies any shortness of breath aside from pain with deep inspiration.  No abdominal pain.  No lightheadedness no near syncope.  No blood in urine.  No weakness numbness tingling of extremities.    Prior to Admission medications  Medication Sig Start Date End Date Taking? Authorizing Provider  HYDROcodone -acetaminophen  (NORCO/VICODIN) 5-325 MG tablet Take 1-2 tablets by mouth every 6 (six) hours as needed. 11/15/24  Yes Armenta Canning, MD  methocarbamol (ROBAXIN) 500 MG tablet Take 1 tablet (500 mg total) by mouth every 6 (six) hours as needed for muscle spasms. 11/15/24  Yes Armenta Canning, MD  acyclovir (ZOVIRAX) 200 MG capsule Take 200 mg by mouth at bedtime. 10/11/20   [provider]  alendronate (FOSAMAX) 70 MG tablet Take 70 mg by mouth once a week. Take with a full glass of water  on an empty stomach.    [provider]  busPIRone (BUSPAR) 10 MG tablet Take 10 mg by mouth 2 (two) times daily. 10/15/20   [provider]  CALCIUM PO Take 500 mg by mouth 2 (two) times daily.    [provider]  cyclobenzaprine (FLEXERIL) 10 MG tablet Take 10 mg by mouth at bedtime. 05/30/20   [provider]  FLUoxetine (PROZAC) 20 MG capsule Take 60 mg by mouth every morning. 06/01/23   [provider]   fluticasone (FLONASE) 50 MCG/ACT nasal spray Place 1 spray into both nostrils daily. 09/08/22 08/17/24  [provider]  gabapentin (NEURONTIN) 300 MG capsule Take 300-1,200 mg by mouth See admin instructions. Take 300 mg in the morning and the afternoon and 1200 mg at bedtime 03/27/20   [provider]  NURTEC 75 MG TBDP Take 75 mg by mouth daily as needed (migraine).    [provider]  omeprazole (PRILOSEC) 20 MG capsule Take 20 mg by mouth daily.    [provider]  RINVOQ 15 MG [phone]  03/27/24   [provider]    Allergies: Bactrim [sulfamethoxazole-trimethoprim] and Bupropion    Review of Systems  Updated Vital Signs BP 110/75 (BP Location: Right Arm)   Pulse 82   Temp 97.9 F (36.6 C) (Oral)   Resp 18   Ht 5' 8 (1.727 m)   Wt 83.9 kg   SpO2 100%   BMI 28.13 kg/m   Physical Exam Constitutional:      Comments: Alert nontoxic well-nourished well-developed.  No respiratory distress.  HENT:     Head: Normocephalic and atraumatic.     Mouth/Throat:     Pharynx: Oropharynx is clear.  Cardiovascular:     Rate and Rhythm: Normal rate and regular rhythm.  Pulmonary:     Comments: Breath sounds are symmetric.  Patient has pain with  deep inspiration.  No crackle or wheeze at this time.  Pain to palpation over the right posterior lateral axillary area of the ribs no visible bruising or contusion at this time. Abdominal:     Comments: Abdomen soft without guarding.  No reproducible tenderness.  Musculoskeletal:        General: No signs of injury. Normal range of motion.  Skin:    General: Skin is warm and dry.  Neurological:     General: No focal deficit present.     Mental Status: She is oriented to person, place, and time.     Coordination: Coordination normal.  Psychiatric:        Mood and Affect: Mood normal.     (all labs ordered are listed, but only abnormal results are displayed) Labs Reviewed - No data to  display  EKG: None  Radiology: DG Ribs Unilateral W/Chest Right Result Date: 11/15/2024 EXAM: 1 VIEW(S) XRAY OF THE RIGHT RIBS AND CHEST 11/15/2024 06:44:00 AM COMPARISON: None available. CLINICAL HISTORY: fall FINDINGS: BONES: Acute fractures of right anterior seventh and eighth ribs. LUNGS AND PLEURA: No consolidation or pulmonary edema. Small left pleural effusion or pleural thickening. No pneumothorax. HEART AND MEDIASTINUM: No acute abnormality of the cardiac and mediastinal silhouettes. CHEST WALL AND SOFT TISSUES: Stimulator in right chest. IMPRESSION: 1. Acute fractures of right anterior seventh and eighth ribs. 2. Small left pleural effusion or pleural thickening. Electronically signed by: Waddell Calk MD 11/15/2024 06:55 AM EST RP Workstation: [id number]     Procedures   Medications Ordered in the ED - No data to display                                  Medical Decision Making  Patient presents as outlined.  She has significant pain with inspiration over the right posterior lateral chest wall but no abdominal pain.  X-ray and reviewed by radiology shows 7th and 8th right rib fractures without apparent pneumothorax or other traumatic injury.  Patient has no reproducible abdominal pain and no lightheadedness.  No blood in the urine.  Injury was yesterday.  At this time I have low suspicion for solid organ injury or any significant intra-abdominal or thoracic trauma resulting in hemorrhage or hematoma.  At this time I do not think CT imaging is indicated.  I have carefully reviewed return precautions and signs and symptoms for return.  Patient voices understanding.  Patient has meloxicam that she takes periodically at home.  We reviewed taking this for baseline pain control.  I could not and Robaxin prescribed for additional pain control.  I have reviewed instructions on using these medications.  Incentive spirometer provided.  I reviewed potential complications of pneumothorax,  pneumonia, pulmonary contusion and return precautions.  Patient voices understanding.      Final diagnoses:  Closed fracture of multiple ribs of right side, initial encounter    ED Discharge Orders          Ordered    HYDROcodone -acetaminophen  (NORCO/VICODIN) 5-325 MG tablet  Every 6 hours PRN        11/15/24 0728    methocarbamol (ROBAXIN) 500 MG tablet  Every 6 hours PRN        11/15/24 9271               Armenta Canning, MD 11/15/24 [phone]

## 2024-11-29 ENCOUNTER — Other Ambulatory Visit: Payer: Self-pay | Admitting: Medical Genetics

## 2024-12-06 ENCOUNTER — Ambulatory Visit
Admission: RE | Admit: 2024-12-06 | Discharge: 2024-12-06 | Disposition: A | Discharge status: Home / Self Care | Source: Ambulatory Visit | Attending: Family Medicine | Admitting: Family Medicine

## 2024-12-06 DIAGNOSIS — Z1231 Encounter for screening mammogram for malignant neoplasm of breast: Secondary | ICD-10-CM

## 2024-12-27 ENCOUNTER — Other Ambulatory Visit: Payer: Self-pay

## 2024-12-27 DIAGNOSIS — Z006 Encounter for examination for normal comparison and control in clinical research program: Secondary | ICD-10-CM

## 2024-12-27 LAB — GENECONNECT MOLECULAR SCREEN

## 2024-12-27 NOTE — Addendum Note (Signed)
 Addended by: BILLIE FRANCIS POUR on: 12/27/2024 09:18 AM   Modules accepted: Orders

## 2025-03-28 ENCOUNTER — Ambulatory Visit: Admitting: Neurology
